# Patient Record
Sex: Female | Born: 1950 | Race: Black or African American | Hispanic: No | Marital: Single | Smoking: Never smoker
Health system: Southern US, Community
[De-identification: ages and names within clinical notes are randomized; demographics above are authoritative.]

## PROBLEM LIST (undated history)

## (undated) DIAGNOSIS — J45909 Unspecified asthma, uncomplicated: Secondary | ICD-10-CM

## (undated) DIAGNOSIS — K219 Gastro-esophageal reflux disease without esophagitis: Secondary | ICD-10-CM

## (undated) HISTORY — PX: BREAST EXCISIONAL BIOPSY: SUR124

## (undated) HISTORY — PX: APPENDECTOMY: SHX54

---

## 2008-05-18 ENCOUNTER — Ambulatory Visit: Payer: Self-pay | Admitting: Family Medicine

## 2008-05-22 ENCOUNTER — Ambulatory Visit: Payer: Self-pay | Admitting: *Deleted

## 2008-06-01 ENCOUNTER — Ambulatory Visit (HOSPITAL_COMMUNITY): Admission: RE | Admit: 2008-06-01 | Discharge: 2008-06-01 | Payer: Self-pay | Admitting: Family Medicine

## 2008-07-17 ENCOUNTER — Ambulatory Visit: Payer: Self-pay | Admitting: Internal Medicine

## 2008-09-28 ENCOUNTER — Encounter: Payer: Self-pay | Admitting: Family Medicine

## 2008-09-28 ENCOUNTER — Ambulatory Visit: Payer: Self-pay | Admitting: Family Medicine

## 2008-09-28 LAB — CONVERTED CEMR LAB
Albumin: 4.5 g/dL (ref 3.5–5.2)
Basophils Absolute: 0 10*3/uL (ref 0.0–0.1)
CO2: 18 meq/L — ABNORMAL LOW (ref 19–32)
Cholesterol: 209 mg/dL — ABNORMAL HIGH (ref 0–200)
Eosinophils Absolute: 0 10*3/uL (ref 0.0–0.7)
HCT: 45.5 % (ref 36.0–46.0)
Hemoglobin: 14.5 g/dL (ref 12.0–15.0)
LDL Cholesterol: 107 mg/dL — ABNORMAL HIGH (ref 0–99)
Lymphocytes Relative: 12 % (ref 12–46)
Lymphs Abs: 2.8 10*3/uL (ref 0.7–4.0)
MCHC: 31.9 g/dL (ref 30.0–36.0)
MCV: 87.8 fL (ref 78.0–100.0)
Monocytes Absolute: 1.6 10*3/uL — ABNORMAL HIGH (ref 0.1–1.0)
Potassium: 3.3 meq/L — ABNORMAL LOW (ref 3.5–5.3)
RBC: 5.18 M/uL — ABNORMAL HIGH (ref 3.87–5.11)
RDW: 15.1 % (ref 11.5–15.5)
Total CHOL/HDL Ratio: 2.5

## 2008-09-29 ENCOUNTER — Encounter: Payer: Self-pay | Admitting: Family Medicine

## 2008-10-02 ENCOUNTER — Ambulatory Visit: Payer: Self-pay | Admitting: Internal Medicine

## 2008-10-08 ENCOUNTER — Ambulatory Visit: Payer: Self-pay | Admitting: Internal Medicine

## 2008-10-08 ENCOUNTER — Encounter: Payer: Self-pay | Admitting: Family Medicine

## 2008-10-08 LAB — CONVERTED CEMR LAB
BUN: 24 mg/dL — ABNORMAL HIGH (ref 6–23)
Basophils Relative: 0 % (ref 0–1)
Chloride: 109 meq/L (ref 96–112)
Eosinophils Absolute: 0.2 10*3/uL (ref 0.0–0.7)
HCT: 42.4 % (ref 36.0–46.0)
Hemoglobin: 13.4 g/dL (ref 12.0–15.0)
Lymphocytes Relative: 28 % (ref 12–46)
MCV: 86.9 fL (ref 78.0–100.0)
Neutro Abs: 8.3 10*3/uL — ABNORMAL HIGH (ref 1.7–7.7)
Platelets: 486 10*3/uL — ABNORMAL HIGH (ref 150–400)
Sodium: 143 meq/L (ref 135–145)

## 2008-11-27 ENCOUNTER — Ambulatory Visit: Payer: Self-pay | Admitting: Family Medicine

## 2008-12-05 ENCOUNTER — Ambulatory Visit (HOSPITAL_COMMUNITY): Admission: RE | Admit: 2008-12-05 | Discharge: 2008-12-05 | Payer: Self-pay | Admitting: Family Medicine

## 2009-01-18 ENCOUNTER — Ambulatory Visit: Payer: Self-pay | Admitting: Family Medicine

## 2009-04-19 ENCOUNTER — Ambulatory Visit: Payer: Self-pay | Admitting: Family Medicine

## 2009-04-19 LAB — CONVERTED CEMR LAB
CO2: 21 meq/L (ref 19–32)
Chloride: 111 meq/L (ref 96–112)
Vit D, 25-Hydroxy: 23 ng/mL — ABNORMAL LOW (ref 30–89)

## 2009-08-27 ENCOUNTER — Ambulatory Visit: Payer: Self-pay | Admitting: Family Medicine

## 2009-08-28 ENCOUNTER — Encounter (INDEPENDENT_AMBULATORY_CARE_PROVIDER_SITE_OTHER): Payer: Self-pay | Admitting: Internal Medicine

## 2009-09-04 ENCOUNTER — Ambulatory Visit (HOSPITAL_COMMUNITY): Admission: RE | Admit: 2009-09-04 | Discharge: 2009-09-04 | Payer: Self-pay | Admitting: Family Medicine

## 2009-11-28 ENCOUNTER — Ambulatory Visit: Payer: Self-pay | Admitting: Internal Medicine

## 2009-12-12 ENCOUNTER — Ambulatory Visit (HOSPITAL_COMMUNITY): Admission: RE | Admit: 2009-12-12 | Discharge: 2009-12-12 | Payer: Self-pay | Admitting: Family Medicine

## 2009-12-23 ENCOUNTER — Ambulatory Visit: Payer: Self-pay | Admitting: Internal Medicine

## 2012-11-21 ENCOUNTER — Emergency Department (HOSPITAL_COMMUNITY): Payer: Worker's Compensation

## 2012-11-21 ENCOUNTER — Emergency Department (HOSPITAL_COMMUNITY)
Admission: EM | Admit: 2012-11-21 | Discharge: 2012-11-21 | Disposition: A | Discharge status: Home / Self Care | Payer: Worker's Compensation | Attending: Emergency Medicine | Admitting: Emergency Medicine

## 2012-11-21 ENCOUNTER — Encounter (HOSPITAL_COMMUNITY): Payer: Self-pay | Admitting: *Deleted

## 2012-11-21 DIAGNOSIS — Y9289 Other specified places as the place of occurrence of the external cause: Secondary | ICD-10-CM | POA: Insufficient documentation

## 2012-11-21 DIAGNOSIS — W268XXA Contact with other sharp object(s), not elsewhere classified, initial encounter: Secondary | ICD-10-CM | POA: Insufficient documentation

## 2012-11-21 DIAGNOSIS — W1809XA Striking against other object with subsequent fall, initial encounter: Secondary | ICD-10-CM | POA: Insufficient documentation

## 2012-11-21 DIAGNOSIS — H538 Other visual disturbances: Secondary | ICD-10-CM | POA: Insufficient documentation

## 2012-11-21 DIAGNOSIS — J45909 Unspecified asthma, uncomplicated: Secondary | ICD-10-CM | POA: Insufficient documentation

## 2012-11-21 DIAGNOSIS — Y990 Civilian activity done for income or pay: Secondary | ICD-10-CM | POA: Insufficient documentation

## 2012-11-21 DIAGNOSIS — IMO0002 Reserved for concepts with insufficient information to code with codable children: Secondary | ICD-10-CM

## 2012-11-21 DIAGNOSIS — S59909A Unspecified injury of unspecified elbow, initial encounter: Secondary | ICD-10-CM | POA: Insufficient documentation

## 2012-11-21 DIAGNOSIS — Z8719 Personal history of other diseases of the digestive system: Secondary | ICD-10-CM | POA: Insufficient documentation

## 2012-11-21 DIAGNOSIS — W010XXA Fall on same level from slipping, tripping and stumbling without subsequent striking against object, initial encounter: Secondary | ICD-10-CM | POA: Insufficient documentation

## 2012-11-21 DIAGNOSIS — S6990XA Unspecified injury of unspecified wrist, hand and finger(s), initial encounter: Secondary | ICD-10-CM | POA: Insufficient documentation

## 2012-11-21 DIAGNOSIS — Z23 Encounter for immunization: Secondary | ICD-10-CM | POA: Insufficient documentation

## 2012-11-21 DIAGNOSIS — W19XXXA Unspecified fall, initial encounter: Secondary | ICD-10-CM

## 2012-11-21 DIAGNOSIS — S0180XA Unspecified open wound of other part of head, initial encounter: Secondary | ICD-10-CM | POA: Insufficient documentation

## 2012-11-21 DIAGNOSIS — Y939 Activity, unspecified: Secondary | ICD-10-CM | POA: Insufficient documentation

## 2012-11-21 DIAGNOSIS — Z789 Other specified health status: Secondary | ICD-10-CM | POA: Insufficient documentation

## 2012-11-21 HISTORY — DX: Unspecified asthma, uncomplicated: J45.909

## 2012-11-21 HISTORY — DX: Gastro-esophageal reflux disease without esophagitis: K21.9

## 2012-11-21 MED ORDER — TETANUS-DIPHTH-ACELL PERTUSSIS 5-2.5-18.5 LF-MCG/0.5 IM SUSP
0.5000 mL | Freq: Once | INTRAMUSCULAR | Status: AC
  Administered 2012-11-21: 0.5 mL via INTRAMUSCULAR
  Filled 2012-11-21: qty 0.5

## 2012-11-21 MED ORDER — TETRACAINE HCL 0.5 % OP SOLN
1.0000 [drp] | Freq: Once | OPHTHALMIC | Status: AC
  Administered 2012-11-21: 1 [drp] via OPHTHALMIC
  Filled 2012-11-21: qty 2

## 2012-11-21 MED ORDER — HYDROCODONE-ACETAMINOPHEN 5-325 MG PO TABS
1.0000 | ORAL_TABLET | Freq: Four times a day (QID) | ORAL | Status: AC | PRN
Start: 2012-11-21 — End: ?

## 2012-11-21 MED ORDER — HYDROCODONE-ACETAMINOPHEN 5-325 MG PO TABS
2.0000 | ORAL_TABLET | Freq: Once | ORAL | Status: AC
  Administered 2012-11-21: 1 via ORAL
  Filled 2012-11-21: qty 1

## 2012-11-21 NOTE — ED Provider Notes (Signed)
Medical screening examination/treatment/procedure(s) were performed by non-physician practitioner and as supervising physician I was immediately available for consultation/collaboration.   Anaiza Behrens M Gurinder Toral, MD 11/21/12 1637 

## 2012-11-21 NOTE — ED Provider Notes (Signed)
History     CSN: [id number]  Arrival date & time 11/21/12  0900   First MD Initiated Contact with Patient 11/21/12 [phone]      Chief Complaint  Patient presents with  . Fall  . Facial Laceration    (Consider location/radiation/quality/duration/timing/severity/associated sxs/prior treatment) HPI Comments: This is a 62 year old female, no pertinent past medical history, who presents to the emergency department with chief complaint of fall. Patient states that she tripped on a sprinkler, and fell to the ground. She braced herself with her arms, but struck her forehead on the concrete. She denies any loss of consciousness. She was wearing her glasses at the time, which cut her left eyebrow. She also complains of swelling of the left eye and blurred vision. She states that her pain is 8/10. Additionally, she complains of left hand and wrist pain. The pain does not radiate. She has not tried taking anything to alleviate her symptoms. She denies chest pain, shortness of breath, and dizziness.  The history is provided by the patient. No language interpreter was used.    Past Medical History  Diagnosis Date  . Asthma   . GERD (gastroesophageal reflux disease)     Past Surgical History  Procedure Laterality Date  . Appendectomy      No family history on file.  History  Substance Use Topics  . Smoking status: Never Smoker   . Smokeless tobacco: Not on file  . Alcohol Use: No    OB History   Grav Para Term Preterm Abortions TAB SAB Ect Mult Living                  Review of Systems  All other systems reviewed and are negative.    Allergies  Review of patient's allergies indicates no known allergies.  Home Medications  No current outpatient prescriptions on file.  BP 160/88  Pulse 103  Temp(Src) 98 F (36.7 C) (Oral)  Resp 18  SpO2 98%  Physical Exam  Nursing note and vitals reviewed. Constitutional: She is oriented to person, place, and time. She appears  well-developed and well-nourished.  HENT:  Head: Normocephalic and atraumatic.  A 3 cm laceration of left eyebrow, moderate swelling around the left eye, no obvious bony deformity or step-offs, tenderness to palpation around the orbit    Eyes: Conjunctivae and EOM are normal. Pupils are equal, round, and reactive to light.  Fluorescein reveals no corneal abrasion.  Tonopen reading attempted, but did not calibrate.  When the reading was attempted the eye was WNL, and was NOT hard  Visual Acuity R 20/40 L 20/200, however patient wears glasses, and did not during exam, significant swelling of the eyelid and surrounding tissue  Conjunctiva is red and inflamed, but no evidence of foreign bodies  EOM intact and not painful  Neck: Normal range of motion. Neck supple.  Cardiovascular: Normal rate and regular rhythm.  Exam reveals no gallop and no friction rub.   No murmur heard. Pulmonary/Chest: Effort normal and breath sounds normal. No respiratory distress. She has no wheezes. She has no rales. She exhibits no tenderness.  Abdominal: Soft. Bowel sounds are normal. She exhibits no distension and no mass. There is no tenderness. There is no rebound and no guarding.  Musculoskeletal: Normal range of motion. She exhibits no edema and no tenderness.  Neurological: She is alert and oriented to person, place, and time.  Skin: Skin is warm and dry.  3 cm laceration to left eyebrow, no foreign bodies  Psychiatric: She has a normal mood and affect. Her behavior is normal. Judgment and thought content normal.    ED Course  Procedures (including critical care time)  Results for orders placed in visit on 08/27/09  CONVERTED [facility] LAB      Result Value Range   Helicobacter Pylori Antibody-IgG 0.8 ISR     Dg Facial Bones Complete  11/21/2012  **ADDENDUM** CREATED: 11/21/2012 11:10:31  The expanded sella turcica is probably related to an empty sella based on the previous head CT exam from 12/05/2008.   **END ADDENDUM** SIGNED BY: Richarda Overlie, M.D.   11/21/2012  *RADIOLOGY REPORT*  Clinical Data: Fall with left arm laceration and bruising over the left orbit.  FACIAL BONES COMPLETE 3+V  Comparison: Head CT 12/05/2008  Findings: Four views of the facial bones were obtained.  There is symmetric aeration of the paranasal sinuses.  There are bilateral dental fillings.  The orbital bones appear to be intact.  Nasal bones are intact.  No gross abnormality to the mandible.  Zygomatic arches are intact. The patient may have an enlarged sella turcica but limited evaluation on this examination.  IMPRESSION: No evidence for a displaced facial bone fracture.  If there is high clinical concern for an occult injury, recommend further evaluation with CT.  Question an enlarged sella turcica.  Original Report Authenticated By: Richarda Overlie, M.D.    Dg Wrist Complete Left  11/21/2012  *RADIOLOGY REPORT*  Clinical Data: Fall, pain.  LEFT WRIST - COMPLETE 3+ VIEW  Comparison: None.  Findings: No acute bony or joint abnormality is identified. Degenerative change is seen about the first [facility] joint.  Soft tissue structures are unremarkable.  IMPRESSION:  1.  No acute finding. 2.  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   Original Report Authenticated By: Holley Dexter, M.D.    Dg Hand Complete Left  11/21/2012  *RADIOLOGY REPORT*  Clinical Data: Fall, pain.  LEFT HAND - COMPLETE 3+ VIEW  Comparison: None.  Findings: No acute bony or joint abnormality is identified.  The patient has some degenerative disease of the first [facility] joint and about the DIP joints.  Soft tissue structures are unremarkable.  IMPRESSION: No acute finding.   Original Report Authenticated By: Holley Dexter, M.D.    Ct Maxillofacial Wo Cm  11/21/2012  *RADIOLOGY REPORT*  Clinical Data: Fall.  Laceration to left supra orbital region.  CT MAXILLOFACIAL WITHOUT CONTRAST  Technique:  Multidetector CT imaging of the maxillofacial structures was performed. Multiplanar CT image  reconstructions were also generated.  Comparison: None.  Findings: The mandible is intact.  Temporomandibular joints are located.  No orbital rim or orbital wall fracture on either side. The zygomatic arches are intact.  Nasal bones are intact.  No evidence for fluid in the paranasal sinuses to suggest hemorrhage. The  IMPRESSION: No acute fracture in the bones of the face.   Original Report Authenticated By: Kennith Center, M.D.     LACERATION REPAIR Performed by: Roxy Horseman Authorized by: Roxy Horseman Consent: Verbal consent obtained. Risks and benefits: risks, benefits and alternatives were discussed Consent given by: patient Patient identity confirmed: provided demographic data Prepped and Draped in normal sterile fashion Wound explored  Laceration Location: Left eyebrow  Laceration Length: 3 cm  No Foreign Bodies seen or palpated  Anesthesia: local infiltration  Local anesthetic: lidocaine 2% without epinephrine  Anesthetic total: 4 ml  Irrigation method: syringe Amount of cleaning: standard  Skin closure: 5-0 prolene  Number of sutures: 4  Technique: simple  Patient tolerance: Patient tolerated the procedure well with no immediate complications.   1. Fall, initial encounter   2. Laceration       MDM  62 year old female with mechanical fall. No loss of consciousness. Laceration left eyebrow. Order facial bones plain film for further evaluation, complete eye exam, and plain films of left wrist.  Pain control with Norco. Patient is ambulatory, and denies chest pain, SOB, and dizziness.  Tetanus updated, laceration repaired, no further visual complaints, patient feels well. She is stable and ready for discharge.        Roxy Horseman, PA-C 11/21/12 1332

## 2012-11-21 NOTE — ED Notes (Signed)
Patient transported to X-ray 

## 2012-11-21 NOTE — ED Notes (Signed)
Pt reports tripping on a sprinkler at work-landed on a concrete floor.  Pt reports wearing her glasses when she fell.  Lac noted on L eye lid, mild bruising noted around her L eye as well.  Pt reports blurred vision when she is able to open her L eye.  Pt also reports "i felt like i wanted to pass out but i made myself not to."  Pt also reports soreness to L knee and L wrist.  No obvious deformity noted at this time.

## 2016-01-10 ENCOUNTER — Encounter: Payer: Self-pay | Admitting: Gastroenterology

## 2016-09-02 DIAGNOSIS — R498 Other voice and resonance disorders: Secondary | ICD-10-CM | POA: Diagnosis not present

## 2016-09-02 DIAGNOSIS — J069 Acute upper respiratory infection, unspecified: Secondary | ICD-10-CM | POA: Diagnosis not present

## 2016-09-24 DIAGNOSIS — E669 Obesity, unspecified: Secondary | ICD-10-CM | POA: Diagnosis not present

## 2016-09-24 DIAGNOSIS — Z6833 Body mass index (BMI) 33.0-33.9, adult: Secondary | ICD-10-CM | POA: Diagnosis not present

## 2017-01-01 DIAGNOSIS — H5213 Myopia, bilateral: Secondary | ICD-10-CM | POA: Diagnosis not present

## 2017-01-15 DIAGNOSIS — J069 Acute upper respiratory infection, unspecified: Secondary | ICD-10-CM | POA: Diagnosis not present

## 2017-01-15 DIAGNOSIS — K219 Gastro-esophageal reflux disease without esophagitis: Secondary | ICD-10-CM | POA: Diagnosis not present

## 2017-01-21 DIAGNOSIS — Z6833 Body mass index (BMI) 33.0-33.9, adult: Secondary | ICD-10-CM | POA: Diagnosis not present

## 2017-01-21 DIAGNOSIS — I10 Essential (primary) hypertension: Secondary | ICD-10-CM | POA: Diagnosis not present

## 2017-01-21 DIAGNOSIS — K219 Gastro-esophageal reflux disease without esophagitis: Secondary | ICD-10-CM | POA: Diagnosis not present

## 2017-01-21 DIAGNOSIS — E669 Obesity, unspecified: Secondary | ICD-10-CM | POA: Diagnosis not present

## 2017-01-21 DIAGNOSIS — Z79899 Other long term (current) drug therapy: Secondary | ICD-10-CM | POA: Diagnosis not present

## 2017-01-21 DIAGNOSIS — Z0000 Encounter for general adult medical examination without abnormal findings: Secondary | ICD-10-CM | POA: Diagnosis not present

## 2017-01-21 DIAGNOSIS — Z23 Encounter for immunization: Secondary | ICD-10-CM | POA: Diagnosis not present

## 2017-01-21 DIAGNOSIS — Z13820 Encounter for screening for osteoporosis: Secondary | ICD-10-CM | POA: Diagnosis not present

## 2017-02-01 DIAGNOSIS — Z0110 Encounter for examination of ears and hearing without abnormal findings: Secondary | ICD-10-CM | POA: Diagnosis not present

## 2017-02-01 DIAGNOSIS — Z0289 Encounter for other administrative examinations: Secondary | ICD-10-CM | POA: Diagnosis not present

## 2017-02-02 DIAGNOSIS — Z111 Encounter for screening for respiratory tuberculosis: Secondary | ICD-10-CM | POA: Diagnosis not present

## 2017-02-02 DIAGNOSIS — Z23 Encounter for immunization: Secondary | ICD-10-CM | POA: Diagnosis not present

## 2017-02-04 DIAGNOSIS — D473 Essential (hemorrhagic) thrombocythemia: Secondary | ICD-10-CM | POA: Insufficient documentation

## 2017-02-11 DIAGNOSIS — D473 Essential (hemorrhagic) thrombocythemia: Secondary | ICD-10-CM | POA: Diagnosis not present

## 2017-04-05 DIAGNOSIS — Z23 Encounter for immunization: Secondary | ICD-10-CM | POA: Diagnosis not present

## 2017-07-15 DIAGNOSIS — R51 Headache: Secondary | ICD-10-CM | POA: Diagnosis not present

## 2017-07-30 DIAGNOSIS — D473 Essential (hemorrhagic) thrombocythemia: Secondary | ICD-10-CM | POA: Diagnosis not present

## 2017-07-30 DIAGNOSIS — R51 Headache: Secondary | ICD-10-CM | POA: Diagnosis not present

## 2017-07-30 DIAGNOSIS — Z79899 Other long term (current) drug therapy: Secondary | ICD-10-CM | POA: Diagnosis not present

## 2017-07-30 DIAGNOSIS — Z23 Encounter for immunization: Secondary | ICD-10-CM | POA: Diagnosis not present

## 2017-07-30 DIAGNOSIS — I10 Essential (primary) hypertension: Secondary | ICD-10-CM | POA: Diagnosis not present

## 2017-08-05 DIAGNOSIS — Z23 Encounter for immunization: Secondary | ICD-10-CM | POA: Diagnosis not present

## 2017-08-16 DIAGNOSIS — Z7982 Long term (current) use of aspirin: Secondary | ICD-10-CM

## 2017-08-16 DIAGNOSIS — D473 Essential (hemorrhagic) thrombocythemia: Secondary | ICD-10-CM

## 2017-11-30 DIAGNOSIS — R300 Dysuria: Secondary | ICD-10-CM | POA: Diagnosis not present

## 2018-02-14 DIAGNOSIS — D473 Essential (hemorrhagic) thrombocythemia: Secondary | ICD-10-CM

## 2018-02-14 DIAGNOSIS — Z7982 Long term (current) use of aspirin: Secondary | ICD-10-CM

## 2018-02-18 DIAGNOSIS — I10 Essential (primary) hypertension: Secondary | ICD-10-CM | POA: Diagnosis not present

## 2018-03-02 DIAGNOSIS — E669 Obesity, unspecified: Secondary | ICD-10-CM | POA: Diagnosis not present

## 2018-03-02 DIAGNOSIS — K219 Gastro-esophageal reflux disease without esophagitis: Secondary | ICD-10-CM | POA: Diagnosis not present

## 2018-03-02 DIAGNOSIS — D473 Essential (hemorrhagic) thrombocythemia: Secondary | ICD-10-CM | POA: Diagnosis not present

## 2018-03-02 DIAGNOSIS — Z23 Encounter for immunization: Secondary | ICD-10-CM | POA: Diagnosis not present

## 2018-03-02 DIAGNOSIS — I10 Essential (primary) hypertension: Secondary | ICD-10-CM | POA: Diagnosis not present

## 2018-03-02 DIAGNOSIS — Z0000 Encounter for general adult medical examination without abnormal findings: Secondary | ICD-10-CM | POA: Diagnosis not present

## 2018-03-02 DIAGNOSIS — E7800 Pure hypercholesterolemia, unspecified: Secondary | ICD-10-CM | POA: Diagnosis not present

## 2018-03-02 DIAGNOSIS — Z6831 Body mass index (BMI) 31.0-31.9, adult: Secondary | ICD-10-CM | POA: Diagnosis not present

## 2018-03-02 DIAGNOSIS — Z13820 Encounter for screening for osteoporosis: Secondary | ICD-10-CM | POA: Diagnosis not present

## 2018-04-01 DIAGNOSIS — M8589 Other specified disorders of bone density and structure, multiple sites: Secondary | ICD-10-CM | POA: Diagnosis not present

## 2018-04-01 DIAGNOSIS — Z13820 Encounter for screening for osteoporosis: Secondary | ICD-10-CM | POA: Diagnosis not present

## 2018-04-01 DIAGNOSIS — Z1231 Encounter for screening mammogram for malignant neoplasm of breast: Secondary | ICD-10-CM | POA: Diagnosis not present

## 2018-04-07 DIAGNOSIS — H524 Presbyopia: Secondary | ICD-10-CM | POA: Diagnosis not present

## 2018-04-07 DIAGNOSIS — Z0100 Encounter for examination of eyes and vision without abnormal findings: Secondary | ICD-10-CM | POA: Diagnosis not present

## 2018-06-27 DIAGNOSIS — Z23 Encounter for immunization: Secondary | ICD-10-CM | POA: Diagnosis not present

## 2018-08-16 DIAGNOSIS — Z7982 Long term (current) use of aspirin: Secondary | ICD-10-CM | POA: Diagnosis not present

## 2018-08-16 DIAGNOSIS — D473 Essential (hemorrhagic) thrombocythemia: Secondary | ICD-10-CM | POA: Diagnosis not present

## 2018-08-16 DIAGNOSIS — Z79899 Other long term (current) drug therapy: Secondary | ICD-10-CM | POA: Diagnosis not present

## 2018-09-02 ENCOUNTER — Other Ambulatory Visit: Payer: Self-pay | Admitting: Physician Assistant

## 2018-09-02 ENCOUNTER — Ambulatory Visit
Admission: RE | Admit: 2018-09-02 | Discharge: 2018-09-02 | Disposition: A | Discharge status: Home / Self Care | Payer: Medicare HMO | Source: Ambulatory Visit | Attending: Physician Assistant | Admitting: Physician Assistant

## 2018-09-02 DIAGNOSIS — M1711 Unilateral primary osteoarthritis, right knee: Secondary | ICD-10-CM | POA: Diagnosis not present

## 2018-09-02 DIAGNOSIS — M25562 Pain in left knee: Principal | ICD-10-CM

## 2018-09-02 DIAGNOSIS — M25561 Pain in right knee: Secondary | ICD-10-CM

## 2018-09-02 DIAGNOSIS — M1712 Unilateral primary osteoarthritis, left knee: Secondary | ICD-10-CM | POA: Diagnosis not present

## 2018-09-05 DIAGNOSIS — J069 Acute upper respiratory infection, unspecified: Secondary | ICD-10-CM | POA: Diagnosis not present

## 2018-09-05 DIAGNOSIS — I10 Essential (primary) hypertension: Secondary | ICD-10-CM | POA: Diagnosis not present

## 2018-09-05 DIAGNOSIS — K219 Gastro-esophageal reflux disease without esophagitis: Secondary | ICD-10-CM | POA: Diagnosis not present

## 2018-10-26 DIAGNOSIS — D473 Essential (hemorrhagic) thrombocythemia: Secondary | ICD-10-CM | POA: Diagnosis not present

## 2019-03-06 DIAGNOSIS — Z0000 Encounter for general adult medical examination without abnormal findings: Secondary | ICD-10-CM | POA: Diagnosis not present

## 2019-03-06 DIAGNOSIS — R070 Pain in throat: Secondary | ICD-10-CM | POA: Diagnosis not present

## 2019-03-06 DIAGNOSIS — D473 Essential (hemorrhagic) thrombocythemia: Secondary | ICD-10-CM | POA: Diagnosis not present

## 2019-03-06 DIAGNOSIS — I10 Essential (primary) hypertension: Secondary | ICD-10-CM | POA: Diagnosis not present

## 2019-03-06 DIAGNOSIS — Z6831 Body mass index (BMI) 31.0-31.9, adult: Secondary | ICD-10-CM | POA: Diagnosis not present

## 2019-03-06 DIAGNOSIS — K219 Gastro-esophageal reflux disease without esophagitis: Secondary | ICD-10-CM | POA: Diagnosis not present

## 2019-03-06 DIAGNOSIS — E669 Obesity, unspecified: Secondary | ICD-10-CM | POA: Diagnosis not present

## 2019-03-16 ENCOUNTER — Encounter: Payer: Self-pay | Admitting: Gastroenterology

## 2019-03-20 DIAGNOSIS — D473 Essential (hemorrhagic) thrombocythemia: Secondary | ICD-10-CM | POA: Diagnosis not present

## 2019-04-05 DIAGNOSIS — Z1231 Encounter for screening mammogram for malignant neoplasm of breast: Secondary | ICD-10-CM | POA: Diagnosis not present

## 2019-05-18 DIAGNOSIS — R69 Illness, unspecified: Secondary | ICD-10-CM | POA: Diagnosis not present

## 2019-07-21 DIAGNOSIS — D473 Essential (hemorrhagic) thrombocythemia: Secondary | ICD-10-CM | POA: Diagnosis not present

## 2019-08-22 DIAGNOSIS — J189 Pneumonia, unspecified organism: Secondary | ICD-10-CM | POA: Diagnosis not present

## 2019-08-22 DIAGNOSIS — U071 COVID-19: Secondary | ICD-10-CM | POA: Diagnosis not present

## 2019-08-22 DIAGNOSIS — J1289 Other viral pneumonia: Secondary | ICD-10-CM | POA: Diagnosis not present

## 2019-08-22 DIAGNOSIS — R109 Unspecified abdominal pain: Secondary | ICD-10-CM | POA: Diagnosis not present

## 2019-08-30 ENCOUNTER — Emergency Department (HOSPITAL_COMMUNITY): Payer: Medicare HMO

## 2019-08-30 ENCOUNTER — Other Ambulatory Visit: Payer: Self-pay

## 2019-08-30 ENCOUNTER — Encounter (HOSPITAL_COMMUNITY): Payer: Self-pay

## 2019-08-30 ENCOUNTER — Inpatient Hospital Stay (HOSPITAL_COMMUNITY)
Admission: EM | Admit: 2019-08-30 | Discharge: 2019-09-05 | DRG: 177 | Disposition: A | Discharge status: Home / Self Care | Payer: Medicare HMO | Attending: Internal Medicine | Admitting: Internal Medicine

## 2019-08-30 DIAGNOSIS — R079 Chest pain, unspecified: Secondary | ICD-10-CM | POA: Diagnosis not present

## 2019-08-30 DIAGNOSIS — M810 Age-related osteoporosis without current pathological fracture: Secondary | ICD-10-CM | POA: Diagnosis present

## 2019-08-30 DIAGNOSIS — R000 Tachycardia, unspecified: Secondary | ICD-10-CM | POA: Diagnosis not present

## 2019-08-30 DIAGNOSIS — J1289 Other viral pneumonia: Secondary | ICD-10-CM | POA: Diagnosis not present

## 2019-08-30 DIAGNOSIS — E876 Hypokalemia: Secondary | ICD-10-CM | POA: Diagnosis present

## 2019-08-30 DIAGNOSIS — J45909 Unspecified asthma, uncomplicated: Secondary | ICD-10-CM | POA: Diagnosis present

## 2019-08-30 DIAGNOSIS — R0902 Hypoxemia: Secondary | ICD-10-CM | POA: Diagnosis not present

## 2019-08-30 DIAGNOSIS — U071 COVID-19: Secondary | ICD-10-CM | POA: Diagnosis not present

## 2019-08-30 DIAGNOSIS — I10 Essential (primary) hypertension: Secondary | ICD-10-CM | POA: Diagnosis present

## 2019-08-30 DIAGNOSIS — Z79899 Other long term (current) drug therapy: Secondary | ICD-10-CM

## 2019-08-30 DIAGNOSIS — T502X5A Adverse effect of carbonic-anhydrase inhibitors, benzothiadiazides and other diuretics, initial encounter: Secondary | ICD-10-CM | POA: Diagnosis present

## 2019-08-30 DIAGNOSIS — R072 Precordial pain: Secondary | ICD-10-CM | POA: Diagnosis not present

## 2019-08-30 DIAGNOSIS — J90 Pleural effusion, not elsewhere classified: Secondary | ICD-10-CM | POA: Diagnosis present

## 2019-08-30 DIAGNOSIS — J1282 Pneumonia due to coronavirus disease 2019: Secondary | ICD-10-CM | POA: Diagnosis present

## 2019-08-30 DIAGNOSIS — J9600 Acute respiratory failure, unspecified whether with hypoxia or hypercapnia: Secondary | ICD-10-CM | POA: Diagnosis present

## 2019-08-30 DIAGNOSIS — R042 Hemoptysis: Secondary | ICD-10-CM | POA: Diagnosis present

## 2019-08-30 DIAGNOSIS — K219 Gastro-esophageal reflux disease without esophagitis: Secondary | ICD-10-CM | POA: Diagnosis present

## 2019-08-30 DIAGNOSIS — J189 Pneumonia, unspecified organism: Secondary | ICD-10-CM | POA: Diagnosis not present

## 2019-08-30 DIAGNOSIS — J9601 Acute respiratory failure with hypoxia: Secondary | ICD-10-CM | POA: Diagnosis present

## 2019-08-30 DIAGNOSIS — R1084 Generalized abdominal pain: Secondary | ICD-10-CM | POA: Diagnosis not present

## 2019-08-30 DIAGNOSIS — I2699 Other pulmonary embolism without acute cor pulmonale: Secondary | ICD-10-CM | POA: Diagnosis present

## 2019-08-30 LAB — D-DIMER, QUANTITATIVE: D-Dimer, Quant: 10.3 ug/mL-FEU — ABNORMAL HIGH (ref 0.00–0.50)

## 2019-08-30 LAB — COMPREHENSIVE METABOLIC PANEL
ALT: 20 U/L (ref 0–44)
AST: 28 U/L (ref 15–41)
Albumin: 2.2 g/dL — ABNORMAL LOW (ref 3.5–5.0)
Alkaline Phosphatase: 96 U/L (ref 38–126)
Anion gap: 20 — ABNORMAL HIGH (ref 5–15)
BUN: 11 mg/dL (ref 8–23)
CO2: 22 mmol/L (ref 22–32)
Calcium: 8.8 mg/dL — ABNORMAL LOW (ref 8.9–10.3)
Chloride: 98 mmol/L (ref 98–111)
Creatinine, Ser: 0.98 mg/dL (ref 0.44–1.00)
GFR calc Af Amer: >60 mL/min (ref >60)
GFR calc non Af Amer: 59 mL/min — ABNORMAL LOW (ref >60)
Glucose, Bld: 101 mg/dL — ABNORMAL HIGH (ref 70–99)
Potassium: 3.4 mmol/L — ABNORMAL LOW (ref 3.5–5.1)
Sodium: 140 mmol/L (ref 135–145)
Total Bilirubin: 1 mg/dL (ref 0.3–1.2)
Total Protein: 11.5 g/dL — ABNORMAL HIGH (ref 6.5–8.1)

## 2019-08-30 LAB — CBC WITH DIFFERENTIAL/PLATELET
Abs Immature Granulocytes: 0.69 10*3/uL — ABNORMAL HIGH (ref 0.00–0.07)
Basophils Absolute: 0.1 10*3/uL (ref 0.0–0.1)
Basophils Relative: 0 %
Eosinophils Absolute: 0.1 10*3/uL (ref 0.0–0.5)
Eosinophils Relative: 1 %
HCT: 39.5 % (ref 36.0–46.0)
Hemoglobin: 12.6 g/dL (ref 12.0–15.0)
Immature Granulocytes: 4 %
Lymphocytes Relative: 10 %
Lymphs Abs: 1.9 10*3/uL (ref 0.7–4.0)
MCH: 30.3 pg (ref 26.0–34.0)
MCHC: 31.9 g/dL (ref 30.0–36.0)
MCV: 95 fL (ref 80.0–100.0)
Monocytes Absolute: 1.2 10*3/uL — ABNORMAL HIGH (ref 0.1–1.0)
Monocytes Relative: 7 %
Neutro Abs: 14.2 10*3/uL — ABNORMAL HIGH (ref 1.7–7.7)
Neutrophils Relative %: 78 %
Platelets: 611 10*3/uL — ABNORMAL HIGH (ref 150–400)
RBC: 4.16 MIL/uL (ref 3.87–5.11)
RDW: 13.8 % (ref 11.5–15.5)
WBC: 18.1 10*3/uL — ABNORMAL HIGH (ref 4.0–10.5)
nRBC: 0 % (ref 0.0–0.2)

## 2019-08-30 LAB — TROPONIN I (HIGH SENSITIVITY)
Troponin I (High Sensitivity): 29 ng/L — ABNORMAL HIGH (ref <18)
Troponin I (High Sensitivity): 23 ng/L — ABNORMAL HIGH (ref <18)

## 2019-08-30 LAB — LACTATE DEHYDROGENASE: LDH: 328 U/L — ABNORMAL HIGH (ref 98–192)

## 2019-08-30 LAB — BRAIN NATRIURETIC PEPTIDE: B Natriuretic Peptide: 72.6 pg/mL (ref 0.0–100.0)

## 2019-08-30 LAB — C-REACTIVE PROTEIN: CRP: 27.9 mg/dL — ABNORMAL HIGH (ref <1.0)

## 2019-08-30 LAB — FIBRINOGEN: Fibrinogen: >800 mg/dL — ABNORMAL HIGH (ref 210–475)

## 2019-08-30 LAB — FERRITIN: Ferritin: 634 ng/mL — ABNORMAL HIGH (ref 11–307)

## 2019-08-30 LAB — LACTIC ACID, PLASMA: Lactic Acid, Venous: 1.6 mmol/L (ref 0.5–1.9)

## 2019-08-30 LAB — TRIGLYCERIDES: Triglycerides: 143 mg/dL (ref <150)

## 2019-08-30 MED ORDER — LACTATED RINGERS IV BOLUS
1000.0000 mL | Freq: Once | INTRAVENOUS | Status: AC
  Administered 2019-08-30: 1000 mL via INTRAVENOUS

## 2019-08-30 NOTE — ED Provider Notes (Signed)
[facility] EMERGENCY DEPARTMENT Provider Note   CSN: [id number] Arrival date & time: 08/30/19  1720     History Chief Complaint  Patient presents with  . Hemoptysis  . Shortness of Breath  . COVID    Anne [name] is a 68 y.o. female.  HPI Anne [name] is a 68 y.o. female with a medical history of asthma, gerd who presents to the ED for right sided chest pain, hemoptysis and shortness of breath. She reports recently was at [facility] and was diagnosed with pneumonia but "I was not treated with antibiotics". She states although did have a covid test upon discharge and was notified that it was positive. She reports since this time has continued to have shortness of breath that worsened today and also started having right sided chest pain and hemoptysis today. She does not take blood thinners. Denies h/o blood clot. She denies cad, states her pain is pleuritic, non radiating and sharp.      Past Medical History:  Diagnosis Date  . Asthma   . GERD (gastroesophageal reflux disease)     Patient Active Problem List   Diagnosis Date Noted  . Acute respiratory failure due to COVID-19 [facility]) 08/31/2019    Past Surgical History:  Procedure Laterality Date  . APPENDECTOMY       OB History   No obstetric history on file.     Family History  Problem Relation Age of Onset  . Diabetes Mellitus II Other     Social History   Tobacco Use  . Smoking status: Never Smoker  . Smokeless tobacco: Never Used  Substance Use Topics  . Alcohol use: No  . Drug use: No    Home Medications Prior to Admission medications   Medication Sig Start Date End Date Taking? Authorizing Provider  acetaminophen (TYLENOL) 500 MG tablet Take 500 mg by mouth every 6 (six) hours as needed (pain).   Yes [provider]  albuterol (PROVENTIL HFA;VENTOLIN HFA) 108 (90 BASE) MCG/ACT inhaler Inhale 2 puffs into the lungs every 6 (six) hours as needed for  wheezing.   Yes [provider]  amLODipine (NORVASC) 10 MG tablet Take 10 mg by mouth daily. 03/06/19  Yes [provider]  calcium carbonate (TUMS - DOSED IN MG ELEMENTAL CALCIUM) 500 MG chewable tablet Chew 1 tablet by mouth daily.   Yes [provider]  chlorthalidone (HYGROTON) 25 MG tablet Take 25 mg by mouth daily. 08/15/19  Yes [provider]  docusate sodium (COLACE) 100 MG capsule Take 200 mg by mouth at bedtime.   Yes [provider]  famotidine (PEPCID) 20 MG tablet Take 20 mg by mouth daily. 07/04/19  Yes [provider]  HYDROcodone-acetaminophen (NORCO/VICODIN) 5-325 MG per tablet Take 1 tablet by mouth every 6 (six) hours as needed for pain. 11/21/12  Yes Montine Circle, PA-C  hydroxyurea (HYDREA) 500 MG capsule Take 500-1,000 mg by mouth See admin instructions. Pt alternates 50 mg every other Anne [name], with 100 mg 07/21/19  Yes [provider]  ibuprofen (ADVIL) 200 MG tablet Take 400 mg by mouth every 6 (six) hours as needed (pain).   Yes [provider]  lansoprazole (PREVACID) 30 MG capsule Take 30 mg by mouth daily. 06/05/19  Yes [provider]  Multiple Vitamin (MULTIVITAMIN WITH MINERALS) TABS Take 1 tablet by mouth daily.   Yes [provider]    Allergies    Patient has no known allergies.  Review of  Systems   Review of Systems  Constitutional: Positive for fatigue. Negative for chills and fever.  HENT: Negative for ear pain and sore throat.   Eyes: Negative for pain and visual disturbance.  Respiratory: Positive for cough and shortness of breath.   Cardiovascular: Positive for chest pain. Negative for palpitations.  Gastrointestinal: Negative for abdominal pain and vomiting.  Genitourinary: Negative for dysuria and hematuria.  Musculoskeletal: Negative for arthralgias and back pain.  Skin: Negative for color change and rash.  Neurological: Negative for seizures and syncope.  All  other systems reviewed and are negative.   Physical Exam Updated Vital Signs BP 131/89   Pulse 83   Temp 99.5 F (37.5 C) (Oral)   Resp 14   Ht 5\' 6"  (1.676 m)   Wt 82.6 kg   SpO2 97%   BMI 29.38 kg/m   Physical Exam Vitals and nursing note reviewed.  Constitutional:      General: She is in acute distress.     Appearance: Normal appearance. She is well-developed. She is not ill-appearing.     Comments: Female who appears stated age, mild acute distress. Comfortable at rest on room air  HENT:     Head: Normocephalic and atraumatic.     Right Ear: External ear normal.     Left Ear: External ear normal.     Nose: Nose normal. No rhinorrhea.     Mouth/Throat:     Mouth: Mucous membranes are moist.  Eyes:     General:        Right eye: No discharge.        Left eye: No discharge.     Conjunctiva/sclera: Conjunctivae normal.  Cardiovascular:     Rate and Rhythm: Normal rate and regular rhythm.     Pulses: Normal pulses.     Heart sounds: Normal heart sounds. No murmur.  Pulmonary:     Effort: Pulmonary effort is normal. No respiratory distress.     Breath sounds: Normal breath sounds. No wheezing or rales.  Abdominal:     General: Abdomen is flat. There is no distension.     Palpations: Abdomen is soft.     Tenderness: There is no abdominal tenderness.  Musculoskeletal:        General: No deformity or signs of injury. Normal range of motion.     Cervical back: Normal range of motion and neck supple.  Skin:    General: Skin is warm and dry.     Capillary Refill: Capillary refill takes less than 2 seconds.     Coloration: Skin is not jaundiced.  Neurological:     General: No focal deficit present.     Mental Status: She is alert. Mental status is at baseline.  Psychiatric:        Mood and Affect: Mood normal.        Behavior: Behavior normal.     ED Results / Procedures / Treatments   Labs (all labs ordered are listed, but only abnormal results are  displayed) Labs Reviewed  CBC WITH DIFFERENTIAL/PLATELET - Abnormal; Notable for the following components:      Result Value   WBC 18.1 (*)    Platelets 611 (*)    Neutro Abs 14.2 (*)    Monocytes Absolute 1.2 (*)    Abs Immature Granulocytes 0.69 (*)    All other components within normal limits  COMPREHENSIVE METABOLIC PANEL - Abnormal; Notable for the following components:   Potassium 3.4 (*)  Glucose, Bld 101 (*)    Calcium 8.8 (*)    Total Protein 11.5 (*)    Albumin 2.2 (*)    GFR calc non Af Amer 59 (*)    Anion gap 20 (*)    All other components within normal limits  D-DIMER, QUANTITATIVE (NOT AT [facility]) - Abnormal; Notable for the following components:   D-Dimer, Quant 10.30 (*)    All other components within normal limits  LACTATE DEHYDROGENASE - Abnormal; Notable for the following components:   LDH 328 (*)    All other components within normal limits  FIBRINOGEN - Abnormal; Notable for the following components:   Fibrinogen >800 (*)    All other components within normal limits  C-REACTIVE PROTEIN - Abnormal; Notable for the following components:   CRP 27.9 (*)    All other components within normal limits  FERRITIN - Abnormal; Notable for the following components:   Ferritin 634 (*)    All other components within normal limits  HEPARIN LEVEL (UNFRACTIONATED) - Abnormal; Notable for the following components:   Heparin Unfractionated <0.10 (*)    All other components within normal limits  COMPREHENSIVE METABOLIC PANEL - Abnormal; Notable for the following components:   Potassium 3.1 (*)    Glucose, Bld 108 (*)    Calcium 8.1 (*)    Albumin 1.9 (*)    All other components within normal limits  CBC WITH DIFFERENTIAL/PLATELET - Abnormal; Notable for the following components:   WBC 12.8 (*)    RBC 3.59 (*)    Hemoglobin 10.9 (*)    HCT 34.4 (*)    Platelets 564 (*)    Neutro Abs 10.4 (*)    Abs Immature Granulocytes 0.57 (*)    All other components within normal  limits  C-REACTIVE PROTEIN - Abnormal; Notable for the following components:   CRP 23.0 (*)    All other components within normal limits  FERRITIN - Abnormal; Notable for the following components:   Ferritin 572 (*)    All other components within normal limits  TROPONIN I (HIGH SENSITIVITY) - Abnormal; Notable for the following components:   Troponin I (High Sensitivity) 29 (*)    All other components within normal limits  TROPONIN I (HIGH SENSITIVITY) - Abnormal; Notable for the following components:   Troponin I (High Sensitivity) 23 (*)    All other components within normal limits  TROPONIN I (HIGH SENSITIVITY) - Abnormal; Notable for the following components:   Troponin I (High Sensitivity) 22 (*)    All other components within normal limits  TROPONIN I (HIGH SENSITIVITY) - Abnormal; Notable for the following components:   Troponin I (High Sensitivity) 22 (*)    All other components within normal limits  CULTURE, BLOOD (ROUTINE X 2)  CULTURE, BLOOD (ROUTINE X 2)  LACTIC ACID, PLASMA  BRAIN NATRIURETIC PEPTIDE  TRIGLYCERIDES  HIV ANTIBODY (ROUTINE TESTING W REFLEX)  PROCALCITONIN  LACTIC ACID, PLASMA  HEPARIN LEVEL (UNFRACTIONATED)  HEPARIN LEVEL (UNFRACTIONATED)  CBC    EKG None  Radiology CT Angio Chest PE W and/or Wo Contrast  Result Date: 08/31/2019 CLINICAL DATA:  Shortness of breath. Chest pain. Hemoptysis. EXAM: CT ANGIOGRAPHY CHEST WITH CONTRAST TECHNIQUE: Multidetector CT imaging of the chest was performed using the standard protocol during bolus administration of intravenous contrast. Multiplanar CT image reconstructions and MIPs were obtained to evaluate the vascular anatomy. CONTRAST:  58mL OMNIPAQUE IOHEXOL 350 MG/ML SOLN COMPARISON:  Radiograph yesterday. FINDINGS: Cardiovascular: Positive for acute PE with filling  defects in the right lower lobe segmental and subsegmental branches. Additional filling defects within the central right middle lobar branches. No  definite left-sided pulmonary emboli. Mild straightening of the intraventricular septum with RV to LV ratio of 1.16. Mild aortic atherosclerosis without aneurysm. Heart is normal in size. No pericardial effusion. Mediastinum/Nodes: Small mediastinal and hilar nodes, all subcentimeter. No thyroid nodule. Small to moderate hiatal hernia with minimal retained contents in the distal esophagus Lungs/Pleura: Small to moderate right pleural effusion. Patchy and consolidative opacities throughout the right lower lobe. Small left pleural effusion. Adjacent compressive atelectasis cells linear atelectasis in the lingula and left lower lobe. Breathing motion artifact partially obscures evaluation of the lung bases. No pulmonary edema. The trachea and mainstem bronchi are patent. Upper Abdomen: No acute findings. Musculoskeletal: Exaggerated thoracic kyphosis. Degenerative change in the spine. There are no acute or suspicious osseous abnormalities. Review of the MIP images confirms the above findings. IMPRESSION: 1. Positive for acute PE in the right lower and middle lobe. Mild right heart strain with straightening of the intraventricular septum and RV to LV ratio of 1.16. 2. Small to moderate right and small left pleural effusions. Patchy and consolidative opacities in the right lower lobe may be atelectasis or pneumonia. Distribution not classic for pulmonary infarct. Breathing motion artifact through the bases partially obscures evaluation. Aortic Atherosclerosis (ICD10-I70.0). Critical Value/emergent results were called by telephone at the time of interpretation on 08/31/2019 at 1:53 am to Dr Leeanne Mannan , who verbally acknowledged these results. Electronically Signed   By: Keith Rake M.D.   On: 08/31/2019 01:54   DG Chest Port 1 View  Result Date: 08/30/2019 CLINICAL DATA:  68 year old female with history of ongoing shortness of breath and chest pain. EXAM: PORTABLE CHEST 1 VIEW COMPARISON:  Chest x-ray  09/13/2015. FINDINGS: Lung volumes are low with bibasilar opacities which may reflect areas of atelectasis and/or consolidation (right greater than left). Moderate right and small left pleural effusions. No evidence of pulmonary edema. Heart size is normal. Upper mediastinal contours are within normal limits. No pneumothorax. IMPRESSION: 1. Low lung volumes with areas of atelectasis and/or consolidation in the in the lung bases bilaterally, with moderate right and small left pleural effusions. Electronically Signed   By: Vinnie Langton M.D.   On: 08/30/2019 20:42   ECHOCARDIOGRAM COMPLETE  Result Date: 08/31/2019   ECHOCARDIOGRAM REPORT   Patient Name:   Anne [name] Date of Exam: 08/31/2019 Medical Rec #:  [id number]              Height:       66.0 in Accession #:    [id number]             Weight:       182.0 lb Date of Birth:  02-Aug-1951               BSA:          1.92 m Patient Age:    57 years               BP:           129/70 mmHg Patient Gender: F                      HR:           69 bpm. Exam Location:  Inpatient Procedure: 2D Echo Indications:    Atrial Fibrillation 427.31 / I48.91  History:  Patient has no prior history of Echocardiogram examinations.                 Acute respiratory failure due to COVID-19.  Sonographer:    Vikki Ports Turrentine Referring Phys: [id number] Rise Patience  Sonographer Comments: Acute respiratory failure due to COVID-19 IMPRESSIONS  1. Left ventricular ejection fraction, by visual estimation, is 60 to 65%. The left ventricle has normal function. There is moderately increased left ventricular hypertrophy.  2. Left ventricular diastolic parameters are consistent with Grade I diastolic dysfunction (impaired relaxation).  3. The left ventricle has no regional wall motion abnormalities.  4. Global right ventricle has normal systolic function.The right ventricular size is normal. No increase in right ventricular wall thickness.  5. Left atrial size was  moderately dilated.  6. Right atrial size was normal.  7. The mitral valve is abnormal. Mild mitral valve regurgitation.  8. The tricuspid valve is grossly normal.  9. The aortic valve is tricuspid. Aortic valve regurgitation is not visualized. 10. The pulmonic valve was grossly normal. Pulmonic valve regurgitation is not visualized. 11. Normal pulmonary artery systolic pressure. FINDINGS  Left Ventricle: Left ventricular ejection fraction, by visual estimation, is 60 to 65%. The left ventricle has normal function. The left ventricle has no regional wall motion abnormalities. There is moderately increased left ventricular hypertrophy. Left ventricular diastolic parameters are consistent with Grade I diastolic dysfunction (impaired relaxation). Indeterminate filling pressures. Right Ventricle: The right ventricular size is normal. No increase in right ventricular wall thickness. Global RV systolic function is has normal systolic function. The tricuspid regurgitant velocity is 2.37 m/s, and with an assumed right atrial pressure  of 3 mmHg, the estimated right ventricular systolic pressure is normal at 25.4 mmHg. Left Atrium: Left atrial size was moderately dilated. Right Atrium: Right atrial size was normal in size Pericardium: There is no evidence of pericardial effusion. Mitral Valve: The mitral valve is abnormal. There is mild thickening of the mitral valve leaflet(s). Mild mitral valve regurgitation. Tricuspid Valve: The tricuspid valve is grossly normal. Tricuspid valve regurgitation is mild. Aortic Valve: The aortic valve is tricuspid. Aortic valve regurgitation is not visualized. Pulmonic Valve: The pulmonic valve was grossly normal. Pulmonic valve regurgitation is not visualized. Pulmonic regurgitation is not visualized. Aorta: The aortic root and ascending aorta are structurally normal, with no evidence of dilitation. IAS/Shunts: No atrial level shunt detected by color flow Doppler.  LEFT VENTRICLE PLAX 2D  LVIDd:         4.10 cm  Diastology LVIDs:         2.60 cm  LV e' lateral:   9.36 cm/s LV PW:         1.20 cm  LV E/e' lateral: 5.9 LV IVS:        1.20 cm  LV e' medial:    7.83 cm/s LVOT diam:     2.00 cm  LV E/e' medial:  7.0 LV SV:         50 ml LV SV Index:   25.01 LVOT Area:     3.14 cm  RIGHT VENTRICLE RV S prime:     14.30 cm/s TAPSE (M-mode): 1.9 cm LEFT ATRIUM             Index       RIGHT ATRIUM           Index LA diam:        4.40 cm 2.29 cm/m  RA Area:     18.50  cm LA Vol (A2C):   35.7 ml 18.58 ml/m RA Volume:   51.10 ml  26.60 ml/m LA Vol (A4C):   77.2 ml 40.18 ml/m LA Biplane Vol: 55.1 ml 28.68 ml/m  AORTIC VALVE LVOT Vmax:   112.00 cm/s LVOT Vmean:  80.300 cm/s LVOT VTI:    0.222 m  AORTA Ao Root diam: 2.70 cm MITRAL VALVE                        TRICUSPID VALVE MV Area (PHT): 4.21 cm             TR Peak grad:   22.4 mmHg MV PHT:        52.20 msec           TR Vmax:        241.00 cm/s MV Decel Time: 180 msec MV E velocity: 54.80 cm/s 103 cm/s  SHUNTS MV A velocity: 63.00 cm/s 70.3 cm/s Systemic VTI:  0.22 m MV E/A ratio:  0.87       1.5       Systemic Diam: 2.00 cm  Lyman Bishop MD Electronically signed by Lyman Bishop MD Signature Date/Time: 08/31/2019/12:24:48 PM    Final     Procedures Procedures (including critical care time)  Medications Ordered in ED Medications  acetaminophen (TYLENOL) tablet 650 mg (650 mg Oral Given 08/31/19 0317)    Or  acetaminophen (TYLENOL) suppository 650 mg ( Rectal See Alternative 08/31/19 0317)  ondansetron (ZOFRAN) tablet 4 mg (has no administration in time range)    Or  ondansetron (ZOFRAN) injection 4 mg (has no administration in time range)  remdesivir 200 mg in sodium chloride 0.9% 250 mL IVPB (0 mg Intravenous Stopped 08/31/19 0541)    Followed by  remdesivir 100 mg in sodium chloride 0.9 % 100 mL IVPB (has no administration in time range)  dexamethasone (DECADRON) injection 6 mg (6 mg Intravenous Given 08/31/19 0318)  heparin ADULT  infusion 100 units/mL (25000 units/252mL sodium chloride 0.45%) (1,550 Units/hr Intravenous Rate/Dose Change 08/31/19 1450)  lactated ringers bolus 1,000 mL (0 mLs Intravenous Stopped 08/30/19 2248)  iohexol (OMNIPAQUE) 350 MG/ML injection 75 mL (75 mLs Intravenous Contrast Given 08/31/19 0106)  tocilizumab (ACTEMRA) 8 mg/kg = 660 mg in sodium chloride 0.9 % 100 mL infusion (0 mg/kg  82.6 kg Intravenous Stopped 08/31/19 0425)  heparin bolus via infusion 4,000 Units (4,000 Units Intravenous Bolus from Bag 08/31/19 0421)  potassium chloride SA (KLOR-CON) CR tablet 20 mEq (20 mEq Oral Given 08/31/19 1450)  heparin bolus via infusion 2,000 Units (2,000 Units Intravenous Bolus from Bag 08/31/19 1449)    ED Course  I have reviewed the triage vital signs and the nursing notes.  Pertinent labs & imaging results that were available during my care of the patient were reviewed by me and considered in my medical decision making (see chart for details).    MDM Rules/Calculators/A&P                      Analyse Eizabeth Zacarias is a 68 y.o. female with a medical history of asthma, gerd who presents to the ED for right sided chest pain, hemoptysis and shortness of breath. She reports recently was at [facility] and was diagnosed with pneumonia but "I was not treated with antibiotics". She states although did have a covid test upon discharge and was notified that it was positive. Shortness of breath that worsened today and also started having  right sided chest pain and hemoptysis   HPI and physical exam as above. Female who appears stated age, mild acute distress. Comfortable at rest on room air, awake, alert, hemodynamically stable, febrile. Low suspicion for acs (troponin is mildly elevated, non ischemic ecg). Pending a cta to rule out PE. I have a high suspicion for PE, she is hemodynamically stable at this time. Cxr demonstrates bibasilar consolidations consistent with covid.  She has a leukocytosis with left  shift, consistent with covid process. She does have an anion gap without evidence of acidosis. Possibly due to starvation ketosis as she reports not eating or drinking well for the past week.  Pt care was handed off to [place] at 0001.  Complete history and physical and current plan have been communicated.  Please refer to their note for the remainder of ED care and ultimate disposition. The above statements and care plan were discussed with my attending physician Dr Wyvonnia Dusky who voiced agreement with plan.       Final Clinical Impression(s) / ED Diagnoses Final diagnoses:  [id number]    Rx / DC Orders ED Discharge Orders    None       Jahron Hunsinger, Lovena Le, MD 08/31/19 1731    Ezequiel Essex, MD 09/01/19 0840    Ezequiel Essex, MD 09/11/19 [phone]

## 2019-08-30 NOTE — ED Triage Notes (Signed)
Pt arrives POV for eval of ongoing SOB and chest pain. Pt reports she was recently dx'd w/ Covid and has just felt worse since that time. Pt reports hemoptysis starting yesterday. Hx of asthma, no thinners, Tachypneic to the 30s in triage. Moderate  respiratory effort

## 2019-08-31 ENCOUNTER — Inpatient Hospital Stay (HOSPITAL_COMMUNITY): Payer: Medicare HMO

## 2019-08-31 ENCOUNTER — Encounter (HOSPITAL_COMMUNITY): Payer: Self-pay | Admitting: Internal Medicine

## 2019-08-31 DIAGNOSIS — U071 COVID-19: Secondary | ICD-10-CM | POA: Diagnosis not present

## 2019-08-31 DIAGNOSIS — J9600 Acute respiratory failure, unspecified whether with hypoxia or hypercapnia: Secondary | ICD-10-CM | POA: Diagnosis not present

## 2019-08-31 DIAGNOSIS — I361 Nonrheumatic tricuspid (valve) insufficiency: Secondary | ICD-10-CM | POA: Diagnosis not present

## 2019-08-31 DIAGNOSIS — J9601 Acute respiratory failure with hypoxia: Secondary | ICD-10-CM | POA: Diagnosis not present

## 2019-08-31 DIAGNOSIS — J1282 Pneumonia due to coronavirus disease 2019: Secondary | ICD-10-CM | POA: Diagnosis not present

## 2019-08-31 DIAGNOSIS — I340 Nonrheumatic mitral (valve) insufficiency: Secondary | ICD-10-CM | POA: Diagnosis not present

## 2019-08-31 DIAGNOSIS — J189 Pneumonia, unspecified organism: Secondary | ICD-10-CM | POA: Diagnosis not present

## 2019-08-31 DIAGNOSIS — E876 Hypokalemia: Secondary | ICD-10-CM | POA: Diagnosis not present

## 2019-08-31 DIAGNOSIS — Z79899 Other long term (current) drug therapy: Secondary | ICD-10-CM | POA: Diagnosis not present

## 2019-08-31 DIAGNOSIS — I10 Essential (primary) hypertension: Secondary | ICD-10-CM | POA: Diagnosis not present

## 2019-08-31 DIAGNOSIS — T502X5A Adverse effect of carbonic-anhydrase inhibitors, benzothiadiazides and other diuretics, initial encounter: Secondary | ICD-10-CM | POA: Diagnosis present

## 2019-08-31 DIAGNOSIS — K219 Gastro-esophageal reflux disease without esophagitis: Secondary | ICD-10-CM | POA: Diagnosis not present

## 2019-08-31 DIAGNOSIS — M810 Age-related osteoporosis without current pathological fracture: Secondary | ICD-10-CM | POA: Diagnosis present

## 2019-08-31 DIAGNOSIS — R0602 Shortness of breath: Secondary | ICD-10-CM | POA: Diagnosis not present

## 2019-08-31 DIAGNOSIS — J45909 Unspecified asthma, uncomplicated: Secondary | ICD-10-CM | POA: Diagnosis not present

## 2019-08-31 DIAGNOSIS — I2699 Other pulmonary embolism without acute cor pulmonale: Secondary | ICD-10-CM | POA: Diagnosis not present

## 2019-08-31 DIAGNOSIS — J90 Pleural effusion, not elsewhere classified: Secondary | ICD-10-CM | POA: Diagnosis not present

## 2019-08-31 DIAGNOSIS — R042 Hemoptysis: Secondary | ICD-10-CM | POA: Diagnosis not present

## 2019-08-31 LAB — COMPREHENSIVE METABOLIC PANEL
ALT: 19 U/L (ref 0–44)
AST: 24 U/L (ref 15–41)
Albumin: 1.9 g/dL — ABNORMAL LOW (ref 3.5–5.0)
Alkaline Phosphatase: 72 U/L (ref 38–126)
Anion gap: 14 (ref 5–15)
BUN: 11 mg/dL (ref 8–23)
CO2: 25 mmol/L (ref 22–32)
Calcium: 8.1 mg/dL — ABNORMAL LOW (ref 8.9–10.3)
Chloride: 99 mmol/L (ref 98–111)
Creatinine, Ser: 0.82 mg/dL (ref 0.44–1.00)
GFR calc Af Amer: >60 mL/min (ref >60)
GFR calc non Af Amer: >60 mL/min (ref >60)
Glucose, Bld: 108 mg/dL — ABNORMAL HIGH (ref 70–99)
Potassium: 3.1 mmol/L — ABNORMAL LOW (ref 3.5–5.1)
Sodium: 138 mmol/L (ref 135–145)
Total Bilirubin: 1 mg/dL (ref 0.3–1.2)
Total Protein: 7.1 g/dL (ref 6.5–8.1)

## 2019-08-31 LAB — CBC WITH DIFFERENTIAL/PLATELET
Abs Immature Granulocytes: 0.57 10*3/uL — ABNORMAL HIGH (ref 0.00–0.07)
Basophils Absolute: 0.1 10*3/uL (ref 0.0–0.1)
Basophils Relative: 0 %
Eosinophils Absolute: 0.2 10*3/uL (ref 0.0–0.5)
Eosinophils Relative: 1 %
HCT: 34.4 % — ABNORMAL LOW (ref 36.0–46.0)
Hemoglobin: 10.9 g/dL — ABNORMAL LOW (ref 12.0–15.0)
Immature Granulocytes: 5 %
Lymphocytes Relative: 9 %
Lymphs Abs: 1.2 10*3/uL (ref 0.7–4.0)
MCH: 30.4 pg (ref 26.0–34.0)
MCHC: 31.7 g/dL (ref 30.0–36.0)
MCV: 95.8 fL (ref 80.0–100.0)
Monocytes Absolute: 0.4 10*3/uL (ref 0.1–1.0)
Monocytes Relative: 3 %
Neutro Abs: 10.4 10*3/uL — ABNORMAL HIGH (ref 1.7–7.7)
Neutrophils Relative %: 82 %
Platelets: 564 10*3/uL — ABNORMAL HIGH (ref 150–400)
RBC: 3.59 MIL/uL — ABNORMAL LOW (ref 3.87–5.11)
RDW: 13.8 % (ref 11.5–15.5)
WBC: 12.8 10*3/uL — ABNORMAL HIGH (ref 4.0–10.5)
nRBC: 0 % (ref 0.0–0.2)

## 2019-08-31 LAB — HEPARIN LEVEL (UNFRACTIONATED)
Heparin Unfractionated: <0.1 IU/mL — ABNORMAL LOW (ref 0.30–0.70)
Heparin Unfractionated: 0.2 IU/mL — ABNORMAL LOW (ref 0.30–0.70)

## 2019-08-31 LAB — ECHOCARDIOGRAM COMPLETE
Height: 66 in
Weight: 2912 oz

## 2019-08-31 LAB — HIV ANTIBODY (ROUTINE TESTING W REFLEX): HIV Screen 4th Generation wRfx: NONREACTIVE

## 2019-08-31 LAB — PROCALCITONIN: Procalcitonin: 0.18 ng/mL

## 2019-08-31 LAB — C-REACTIVE PROTEIN: CRP: 23 mg/dL — ABNORMAL HIGH (ref <1.0)

## 2019-08-31 LAB — TROPONIN I (HIGH SENSITIVITY)
Troponin I (High Sensitivity): 22 ng/L — ABNORMAL HIGH (ref <18)
Troponin I (High Sensitivity): 22 ng/L — ABNORMAL HIGH (ref <18)

## 2019-08-31 LAB — FERRITIN: Ferritin: 572 ng/mL — ABNORMAL HIGH (ref 11–307)

## 2019-08-31 MED ORDER — ACETAMINOPHEN 650 MG RE SUPP: 650.0000 mg | Freq: Four times a day (QID) | RECTAL | Status: DC | PRN

## 2019-08-31 MED ORDER — POTASSIUM CHLORIDE CRYS ER 20 MEQ PO TBCR
20.0000 meq | EXTENDED_RELEASE_TABLET | Freq: Once | ORAL | Status: AC
  Administered 2019-08-31: 20 meq via ORAL
  Filled 2019-08-31: qty 1

## 2019-08-31 MED ORDER — ENOXAPARIN SODIUM 40 MG/0.4ML ~~LOC~~ SOLN: 40.0000 mg | Freq: Every day | SUBCUTANEOUS | Status: DC

## 2019-08-31 MED ORDER — TOCILIZUMAB 400 MG/20ML IV SOLN
8.0000 mg/kg | Freq: Once | INTRAVENOUS | Status: AC
  Administered 2019-08-31: 660 mg via INTRAVENOUS
  Filled 2019-08-31 (×2): qty 33

## 2019-08-31 MED ORDER — SODIUM CHLORIDE 0.9 % IV SOLN
200.0000 mg | Freq: Once | INTRAVENOUS | Status: AC
  Administered 2019-08-31: 200 mg via INTRAVENOUS
  Filled 2019-08-31 (×2): qty 40

## 2019-08-31 MED ORDER — IOHEXOL 350 MG/ML SOLN
75.0000 mL | Freq: Once | INTRAVENOUS | Status: AC | PRN
  Administered 2019-08-31: 75 mL via INTRAVENOUS

## 2019-08-31 MED ORDER — HEPARIN BOLUS VIA INFUSION
2000.0000 [IU] | Freq: Once | INTRAVENOUS | Status: AC
  Administered 2019-09-01: 2000 [IU] via INTRAVENOUS
  Filled 2019-08-31: qty 2000

## 2019-08-31 MED ORDER — DEXAMETHASONE SODIUM PHOSPHATE 10 MG/ML IJ SOLN
6.0000 mg | Freq: Every day | INTRAMUSCULAR | Status: DC
  Administered 2019-08-31 – 2019-09-05 (×6): 6 mg via INTRAVENOUS
  Filled 2019-08-31 (×5): qty 1

## 2019-08-31 MED ORDER — HEPARIN BOLUS VIA INFUSION
4000.0000 [IU] | Freq: Once | INTRAVENOUS | Status: AC
  Administered 2019-08-31: 4000 [IU] via INTRAVENOUS
  Filled 2019-08-31: qty 4000

## 2019-08-31 MED ORDER — HEPARIN (PORCINE) 25000 UT/250ML-% IV SOLN
1250.0000 [IU]/h | INTRAVENOUS | Status: DC
  Administered 2019-08-31: 1300 [IU]/h via INTRAVENOUS
  Administered 2019-09-01 – 2019-09-02 (×2): 1600 [IU]/h via INTRAVENOUS
  Administered 2019-09-02: 1250 [IU]/h via INTRAVENOUS
  Filled 2019-08-31 (×4): qty 250

## 2019-08-31 MED ORDER — ONDANSETRON HCL 4 MG PO TABS: 4.0000 mg | ORAL_TABLET | Freq: Four times a day (QID) | ORAL | Status: DC | PRN

## 2019-08-31 MED ORDER — ACETAMINOPHEN 325 MG PO TABS
650.0000 mg | ORAL_TABLET | Freq: Four times a day (QID) | ORAL | Status: DC | PRN
  Administered 2019-08-31 – 2019-09-05 (×12): 650 mg via ORAL
  Filled 2019-08-31 (×12): qty 2

## 2019-08-31 MED ORDER — HEPARIN BOLUS VIA INFUSION
2000.0000 [IU] | Freq: Once | INTRAVENOUS | Status: AC
  Administered 2019-08-31: 2000 [IU] via INTRAVENOUS
  Filled 2019-08-31: qty 2000

## 2019-08-31 MED ORDER — ONDANSETRON HCL 4 MG/2ML IJ SOLN
4.0000 mg | Freq: Four times a day (QID) | INTRAMUSCULAR | Status: DC | PRN
  Administered 2019-09-01 – 2019-09-04 (×6): 4 mg via INTRAVENOUS
  Filled 2019-08-31 (×6): qty 2

## 2019-08-31 MED ORDER — SODIUM CHLORIDE 0.9 % IV SOLN
100.0000 mg | Freq: Every day | INTRAVENOUS | Status: AC
  Administered 2019-09-01 – 2019-09-04 (×4): 100 mg via INTRAVENOUS
  Filled 2019-08-31: qty 100
  Filled 2019-08-31 (×3): qty 20

## 2019-08-31 NOTE — ED Notes (Signed)
Unable to check pt pulse ox while ambulating. Pt became very weak upon standing, RR of 40, O2 sats went to 89% on room air, and the patient began coughing.

## 2019-08-31 NOTE — Progress Notes (Signed)
  Echocardiogram 2D Echocardiogram has been performed.  Nolyn Eilert A Sharlena Kristensen 08/31/2019, 11:13 AM

## 2019-08-31 NOTE — Progress Notes (Signed)
ANTICOAGULATION CONSULT NOTE  Pharmacy Consult for Heparin Indication: pulmonary embolus  No Known Allergies  Patient Measurements: Height: 5\' 6"  (167.6 cm) Weight: 182 lb (82.6 kg) IBW/kg (Calculated) : 59.3 Heparin Dosing Weight: 75 kg  Vital Signs: BP: 131/80 (12/31 2300) Pulse Rate: 75 (12/31 2300)  Labs: Recent Labs    08/30/19 1911 08/30/19 2136 08/31/19 0540 08/31/19 0801 08/31/19 1005 08/31/19 2246  HGB 12.6  --  10.9*  --   --   --   HCT 39.5  --  34.4*  --   --   --   PLT 611*  --  564*  --   --   --   HEPARINUNFRC  --   --   --   --  <0.10* 0.20*  CREATININE 0.98  --  0.82  --   --   --   TROPONINIHS 29* 23* 22* 22*  --   --     Estimated Creatinine Clearance: 71.1 mL/min (by C-G formula based on SCr of 0.82 mg/dL).  Assessment: 68 y.o. female with PE for heparin  Goal of Therapy:  Heparin level 0.3-0.7 units/ml Monitor platelets by anticoagulation protocol: Yes   Plan:  Heparin 2000 units IV bolus, then increase heparin 1800 units/hr Check heparin level in 8 hours.   Caryl Pina 08/31/2019,11:36 PM

## 2019-08-31 NOTE — ED Notes (Signed)
Two RNs attempted to start 2nd IV, unsuccessful.  Will consult IV team

## 2019-08-31 NOTE — Progress Notes (Signed)
ANTICOAGULATION CONSULT NOTE - Initial Consult  Pharmacy Consult for Heparin Indication: pulmonary embolus  No Known Allergies  Patient Measurements: Height: 5\' 6"  (167.6 cm) Weight: 182 lb (82.6 kg) IBW/kg (Calculated) : 59.3 Heparin Dosing Weight: 75 kg  Vital Signs: Temp: 99.5 F (37.5 C) (12/30 1928) Temp Source: Oral (12/30 1928) BP: 144/69 (12/31 0051) Pulse Rate: 86 (12/31 0141)  Labs: Recent Labs    08/30/19 1911 08/30/19 2136  HGB 12.6  --   HCT 39.5  --   PLT 611*  --   CREATININE 0.98  --   TROPONINIHS 29* 23*    Estimated Creatinine Clearance: 59.5 mL/min (by C-G formula based on SCr of 0.98 mg/dL).   Medical History: Past Medical History:  Diagnosis Date  . Asthma   . GERD (gastroesophageal reflux disease)     Medications:  No current facility-administered medications on file prior to encounter.   Current Outpatient Medications on File Prior to Encounter  Medication Sig Dispense Refill  . acetaminophen (TYLENOL) 500 MG tablet Take 500 mg by mouth every 6 (six) hours as needed (pain).    Marland Kitchen albuterol (PROVENTIL HFA;VENTOLIN HFA) 108 (90 BASE) MCG/ACT inhaler Inhale 2 puffs into the lungs every 6 (six) hours as needed for wheezing.    Marland Kitchen amLODipine (NORVASC) 10 MG tablet Take 10 mg by mouth daily.    . calcium carbonate (TUMS - DOSED IN MG ELEMENTAL CALCIUM) 500 MG chewable tablet Chew 1 tablet by mouth daily.    . chlorthalidone (HYGROTON) 25 MG tablet Take 25 mg by mouth daily.    Marland Kitchen docusate sodium (COLACE) 100 MG capsule Take 200 mg by mouth at bedtime.    . famotidine (PEPCID) 20 MG tablet Take 20 mg by mouth daily.    Marland Kitchen HYDROcodone-acetaminophen (NORCO/VICODIN) 5-325 MG per tablet Take 1 tablet by mouth every 6 (six) hours as needed for pain. 7 tablet 0  . hydroxyurea (HYDREA) 500 MG capsule Take 500-1,000 mg by mouth See admin instructions. Pt alternates 50 mg every other day, with 100 mg    . ibuprofen (ADVIL) 200 MG tablet Take 400 mg by mouth  every 6 (six) hours as needed (pain).    Marland Kitchen lansoprazole (PREVACID) 30 MG capsule Take 30 mg by mouth daily.    . Multiple Vitamin (MULTIVITAMIN WITH MINERALS) TABS Take 1 tablet by mouth daily.       Assessment: 68 y.o. female with PE for heparin  Goal of Therapy:  Heparin level 0.3-0.7 units/ml Monitor platelets by anticoagulation protocol: Yes   Plan:  Heparin 4000 units IV bolus, then start heparin 1300 units/hr Check heparin level in 6 hours.   Caryl Pina 08/31/2019,2:00 AM

## 2019-08-31 NOTE — ED Notes (Signed)
Patient awakened from her nap and reports she feels much better. Reports increased energy and no longer is experiencing chest pain. She is at the side of her bed eating her lunch. Has no complaints.

## 2019-08-31 NOTE — ED Notes (Signed)
+  tele  Breakfast ordered 

## 2019-08-31 NOTE — ED Notes (Signed)
Lunch Tray Ordered @ 1108.  

## 2019-08-31 NOTE — Progress Notes (Signed)
ANTICOAGULATION CONSULT NOTE  Pharmacy Consult for Heparin Indication: pulmonary embolus  No Known Allergies  Patient Measurements: Height: 5\' 6"  (167.6 cm) Weight: 182 lb (82.6 kg) IBW/kg (Calculated) : 59.3 Heparin Dosing Weight: 75 kg  Vital Signs: BP: 125/78 (12/31 1315) Pulse Rate: 64 (12/31 1315)  Labs: Recent Labs    08/30/19 1911 08/30/19 2136 08/31/19 0540 08/31/19 0801 08/31/19 1005  HGB 12.6  --  10.9*  --   --   HCT 39.5  --  34.4*  --   --   PLT 611*  --  564*  --   --   HEPARINUNFRC  --   --   --   --  <0.10*  CREATININE 0.98  --  0.82  --   --   TROPONINIHS 29* 23* 22* 22*  --     Estimated Creatinine Clearance: 71.1 mL/min (by C-G formula based on SCr of 0.82 mg/dL).  Assessment: 68 y.o. female continues on IV heparin for PE. Initial heparin level is undetectable. Hgb is down to 10.9 and platelets are elevated. No bleeding noted.   Goal of Therapy:  Heparin level 0.3-0.7 units/ml Monitor platelets by anticoagulation protocol: Yes   Plan:  Heparin bolus 2000 units IV x 1 Increase heparin gtt to 1550 units/hr Check a 6 hr heparin level Daily heparin level and CBC  Salome Arnt, PharmD, BCPS Clinical Pharmacist Please see AMION for all pharmacy numbers 08/31/2019 2:50 PM

## 2019-08-31 NOTE — H&P (Addendum)
History and Physical    Anne [name] [id number] DOB: 1951/07/28 DOA: 08/30/2019  PCP: Greig Right, MD  Patient coming from: Home.  Chief Complaint: Shortness of breath.  HPI: Anne [name] is a 68 y.o. female with no significant past medical history presents to the ER because of worsening shortness of breath.  Patient states she had a right-sided pleuritic type of chest pain which was ongoing for last 1 week and had gone to the ER at [facility] about a week ago and was diagnosed with COVID-19 infection.  Patient at that time was not placed on any medication.  Since patient became more short of breath since then patient decided to come to the ER today.  Patient also has benign subjective feeling of fever chills denies any nausea vomiting or diarrhea.  ED Course: In the ER patient is febrile with temperature 100.9 F requiring 2 L oxygen chest x-ray showing bilateral infiltrates and pleural effusion.  Patient's COVID-19 infection positive results are available in the media section of the chart.  Labs show potassium 3.4 albumin 2.2 troponin high since he was 29 and 23.  CRP 27.9 WBC 18.1 D-dimer was 10.3.  EKG was showing sinus tachycardia.  At the time of this dictation patient CT angiogram was pending.  Patient started on IV Decadron remdesivir admitted for hypoxic respiratory failure secondary to Covid infection.  Review of Systems: As per HPI, rest all negative.   Past Medical History:  Diagnosis Date  . Asthma   . GERD (gastroesophageal reflux disease)     Past Surgical History:  Procedure Laterality Date  . APPENDECTOMY       reports that she has never smoked. She has never used smokeless tobacco. She reports that she does not drink alcohol or use drugs.  No Known Allergies  Family History  Problem Relation Age of Onset  . Diabetes Mellitus II Other     Prior to Admission medications   Medication Sig Start Date End Date Taking? Authorizing  Provider  albuterol (PROVENTIL HFA;VENTOLIN HFA) 108 (90 BASE) MCG/ACT inhaler Inhale 2 puffs into the lungs every 6 (six) hours as needed for wheezing.    [provider]  calcium carbonate (TUMS - DOSED IN MG ELEMENTAL CALCIUM) 500 MG chewable tablet Chew 1 tablet by mouth daily.    [provider]  HYDROcodone-acetaminophen (NORCO/VICODIN) 5-325 MG per tablet Take 1 tablet by mouth every 6 (six) hours as needed for pain. 11/21/12   Montine Circle, PA-C  Multiple Vitamin (MULTIVITAMIN WITH MINERALS) TABS Take 1 tablet by mouth daily.    [provider]    Physical Exam: Constitutional: Moderately built and nourished. Vitals:   08/30/19 1727 08/30/19 1755 08/30/19 1928 08/31/19 0051  BP: 126/83  (!) 155/75 (!) 144/69  Pulse: (!) 116  (!) 106 94  Resp:   (!) 22 (!) 25  Temp: (!) 100.9 F (38.3 C)  99.5 F (37.5 C)   TempSrc: Oral  Oral   SpO2: 98%  92% 97%  Weight:  82.6 kg    Height:  5\' 6"  (1.676 m)     Eyes: Anicteric no pallor. ENMT: No discharge from the ears eyes nose or mouth. Neck: No mass felt.  No neck rigidity. Respiratory: No rhonchi or crepitations. Cardiovascular: S1-S2 heard. Abdomen: Soft nontender bowel sound present. Musculoskeletal: No edema.  No joint effusion. Skin: No rash. Neurologic: Alert awake oriented to time place and person.  Moves all extremities. Psychiatric: Appears normal per normal  affect.   Labs on Admission: I have personally reviewed following labs and imaging studies  CBC: Recent Labs  Lab 08/30/19 1911  WBC 18.1*  NEUTROABS 14.2*  HGB 12.6  HCT 39.5  MCV 95.0  PLT [id number]*   Basic Metabolic Panel: Recent Labs  Lab 08/30/19 1911  NA 140  K 3.4*  CL 98  CO2 22  GLUCOSE 101*  BUN 11  CREATININE 0.98  CALCIUM 8.8*   GFR: Estimated Creatinine Clearance: 59.5 mL/min (by C-G formula based on SCr of 0.98 mg/dL). Liver Function Tests: Recent Labs  Lab 08/30/19 1911  AST 28  ALT 20  ALKPHOS 96    BILITOT 1.0  PROT 11.5*  ALBUMIN 2.2*   No results for input(s): LIPASE, AMYLASE in the last 168 hours. No results for input(s): AMMONIA in the last 168 hours. Coagulation Profile: No results for input(s): INR, PROTIME in the last 168 hours. Cardiac Enzymes: No results for input(s): CKTOTAL, CKMB, CKMBINDEX, TROPONINI in the last 168 hours. BNP (last 3 results) No results for input(s): PROBNP in the last 8760 hours. HbA1C: No results for input(s): HGBA1C in the last 72 hours. CBG: No results for input(s): GLUCAP in the last 168 hours. Lipid Profile: Recent Labs    08/30/19 1911  TRIG 143   Thyroid Function Tests: No results for input(s): TSH, T4TOTAL, FREET4, T3FREE, THYROIDAB in the last 72 hours. Anemia Panel: Recent Labs    08/30/19 2014  FERRITIN 634*   Urine analysis: No results found for: COLORURINE, APPEARANCEUR, LABSPEC, PHURINE, GLUCOSEU, HGBUR, BILIRUBINUR, KETONESUR, PROTEINUR, UROBILINOGEN, NITRITE, LEUKOCYTESUR Sepsis Labs: @LABRCNTIP (procalcitonin:4,lacticidven:4) )No results found for this or any previous visit (from the past 240 hour(s)).   Radiological Exams on Admission: DG Chest Port 1 View  Result Date: 08/30/2019 CLINICAL DATA:  68 year old female with history of ongoing shortness of breath and chest pain. EXAM: PORTABLE CHEST 1 VIEW COMPARISON:  Chest x-ray 09/13/2015. FINDINGS: Lung volumes are low with bibasilar opacities which may reflect areas of atelectasis and/or consolidation (right greater than left). Moderate right and small left pleural effusions. No evidence of pulmonary edema. Heart size is normal. Upper mediastinal contours are within normal limits. No pneumothorax. IMPRESSION: 1. Low lung volumes with areas of atelectasis and/or consolidation in the in the lung bases bilaterally, with moderate right and small left pleural effusions. Electronically Signed   By: Vinnie Langton M.D.   On: 08/30/2019 20:42    EKG: Independently reviewed.   Sinus tachycardia.  Assessment/Plan Principal Problem:   Acute respiratory failure due to COVID-19 ([place])    1. Acute respiratory failure with hypoxia secondary to COVID-19 infection for which I have started patient IV Decadron and IV remdesivir.  Since patient has significant elevated CRP and is hypoxic with chest x-ray showing infiltrate I discussed with patient about using Actemra and also assist with off label use and also the contraindications patient is agreeable to use. 2. Mild hypokalemia replace and recheck. 3. Bilateral pleural effusion cause not clear.  Patient does have low albumin levels which could be contributing.  We need to check if patient has any urine proteinuria or liver pathology for the low albumin.  Given that patient has acute respiratory failure with hypoxia with COVID-19 infection will need inpatient status.  Addendum -CT angiogram of the chest shows pulmonary embolism with mild strain pattern.  Patient is hemodynamically stable.  Will start patient on IV heparin check 2D echo check further cardiac markers and pulmonary embolism likely precipitated by Covid infection.  Does show pleural effusion for which will need further work-up.   DVT prophylaxis: Heparin infusion. Code Status: Full code. Family Communication: Discussed with patient. Disposition Plan: Home. Consults called: None. Admission status: Inpatient.   Rise Patience MD [facility] Pager [phone].  If 7PM-7AM, please contact night-coverage [email or web address] Password [email or web address]  08/31/2019, 1:27 AM

## 2019-08-31 NOTE — Progress Notes (Signed)
I agree with plan of care and history and physical as per my partner Dr. Hal Hope who admitted this patient this morning  68 year old female known history of asthma, reflux, HTN, osteoporosis came to ER because of worsening shortness of breath Went to [place] ED and was diagnosed with coronavirus infection T-max in the emergency room 100.9 requiring 2 L of oxygen CXR = bilateral infiltrates pleural effusion Ferritin 572 CRP 23 Procalcitonin 0.18 D-dimer 10.3 BNP 72.6  Potassium 3.1, WBC 12.8 CT chest positive for acute PE right middle and lower lobe right heart strain is straightening of IV septum small to moderate right and small left pleural effusions  Patient started on IV heparin echocardiogram ordered  Patient started also on remdesivir Decadron and counseled regarding Actemra as per admitting physician andx first dose given  O/e BP 129/70   Pulse 69   Temp 99.5 F (37.5 C) (Oral)   Resp 15   Ht 5\' 6"  (1.676 m)   Wt 82.6 kg   SpO2 96%   BMI 29.38 kg/m  Patient alert pleasant alert quiet voice no distress S1-S2 no murmur rub or gallop Chest clear No lower extremity edema Abdomen soft nontender no rebound no guarding  P  Continue poc as per orders was able to wean her down to 1 liter oxygen -If continues to have minimal oxygen requirements may be due to complete remdesivir at outpatient clinic  Verneita Griffes, MD [facility] 10:00 AM

## 2019-09-01 DIAGNOSIS — U071 COVID-19: Secondary | ICD-10-CM | POA: Diagnosis not present

## 2019-09-01 DIAGNOSIS — J9600 Acute respiratory failure, unspecified whether with hypoxia or hypercapnia: Secondary | ICD-10-CM | POA: Diagnosis not present

## 2019-09-01 LAB — CBC
HCT: 36 % (ref 36.0–46.0)
Hemoglobin: 10.7 g/dL — ABNORMAL LOW (ref 12.0–15.0)
MCH: 30.1 pg (ref 26.0–34.0)
MCHC: 29.7 g/dL — ABNORMAL LOW (ref 30.0–36.0)
MCV: 101.1 fL — ABNORMAL HIGH (ref 80.0–100.0)
Platelets: 506 10*3/uL — ABNORMAL HIGH (ref 150–400)
RBC: 3.56 MIL/uL — ABNORMAL LOW (ref 3.87–5.11)
RDW: 13.5 % (ref 11.5–15.5)
WBC: 14.1 10*3/uL — ABNORMAL HIGH (ref 4.0–10.5)
nRBC: 0 % (ref 0.0–0.2)

## 2019-09-01 LAB — HEPARIN LEVEL (UNFRACTIONATED)
Heparin Unfractionated: 0.77 IU/mL — ABNORMAL HIGH (ref 0.30–0.70)
Heparin Unfractionated: 0.76 IU/mL — ABNORMAL HIGH (ref 0.30–0.70)

## 2019-09-01 LAB — LACTIC ACID, PLASMA: Lactic Acid, Venous: 1.9 mmol/L (ref 0.5–1.9)

## 2019-09-01 NOTE — Progress Notes (Signed)
Hospitalist daily note   Anne [name] [id number] DOB: 1951-04-23 DOA: 08/30/2019  PCP: Greig Right, MD   Narrative:  69 year old female known history of asthma, reflux, HTN, osteoporosis came to ER because of worsening shortness of breath Went to [place] ED and was diagnosed with coronavirus infection T-max in the emergency room 100.9 requiring 2 L of oxygen  Data Reviewed:  CXR = bilateral infiltrates pleural effusion Ferritin 572 CRP 23-- Procalcitonin 0.18 D-dimer 10.3 BNP 72.6 CT chest positive for acute PE right middle and lower lobe right heart strain is straightening of IV septum small to moderate right and small left pleural effusions Echo 12/31 Ef 60-65% grd 1 DD  Assessment & Plan: Coronavirus 19 infection  Requested labs be done since this am have not yet been done Patient however stable remdisivir through 1/4, steroids through 1/9 Rpt labs when able Pulmonary embolism continue Heparin-monior trends and hemoglobin-transition to NOAC for at least 3 mo Leukocytosis Recheck cxr in am rule out PNA Check PCT Asthma  No family paitent stable needs to complete Remdisivir in house  Subjective: Awake alert in nad no focal deficit needing minimal oxygen Consultants:   none Procedures:   n Antimicrobials:   n    Objective: Vitals:   09/01/19 0645 09/01/19 0700 09/01/19 0715 09/01/19 0730  BP: 134/70 (!) 114/56 110/62 (!) 109/58  Pulse: 71 (!) 56 61 61  Resp: (!) 21 17 16 14   Temp:      TempSrc:      SpO2: 100% 100% 100% 100%  Weight:      Height:       No intake or output data in the 24 hours ending 09/01/19 0833 Filed Weights   08/30/19 1755  Weight: 82.6 kg    Examination: Awake alert in no distress On oxygen cta b no added sound no rales no rhonchi abd soft nt nd  No le edema  Scheduled Meds: . dexamethasone (DECADRON) injection  6 mg Intravenous Daily   Continuous Infusions: . heparin 1,800 Units/hr (09/01/19 0018)  .  remdesivir 100 mg in NS 100 mL       LOS: 1 day   Time spent:  [place], MD [facility]

## 2019-09-01 NOTE — ED Notes (Signed)
Lunch tray ordered 

## 2019-09-01 NOTE — Progress Notes (Signed)
ANTICOAGULATION CONSULT NOTE  Pharmacy Consult for Heparin Indication: pulmonary embolus  No Known Allergies  Patient Measurements: Height: 5\' 6"  (167.6 cm) Weight: 182 lb (82.6 kg) IBW/kg (Calculated) : 59.3 Heparin Dosing Weight: 75 kg  Vital Signs: BP: 122/76 (01/01 0800) Pulse Rate: 76 (01/01 1000)  Labs: Recent Labs    08/30/19 1911 08/30/19 2136 08/31/19 0540 08/31/19 0801 08/31/19 1005 08/31/19 2246 09/01/19 0800  HGB 12.6  --  10.9*  --   --   --  10.7*  HCT 39.5  --  34.4*  --   --   --  36.0  PLT 611*  --  564*  --   --   --  506*  HEPARINUNFRC  --   --   --   --  <0.10* 0.20* 0.77*  CREATININE 0.98  --  0.82  --   --   --   --   TROPONINIHS 29* 23* 22* 22*  --   --   --     Estimated Creatinine Clearance: 71.1 mL/min (by C-G formula based on SCr of 0.82 mg/dL).  Assessment: 69 y.o. female continues on IV heparin for acute PE.  Heparin level is now supra-therapeutic.  Confirmed with phlebotomist that lab was drawn appropriately.  No bleeding reported.  Goal of Therapy:  Heparin level 0.3-0.7 units/ml Monitor platelets by anticoagulation protocol: Yes   Plan:  Reduce heparin gtt to 1700 units/hr Check a 6 hr HL Daily heparin level and CBC  Zamzam Whinery D. Mina Marble, PharmD, BCPS, [place] 09/01/2019, 10:32 AM

## 2019-09-01 NOTE — Progress Notes (Signed)
ANTICOAGULATION CONSULT NOTE - Follow Up  Pharmacy Consult for Heparin Indication: pulmonary embolus  No Known Allergies  Patient Measurements: Height: 5\' 6"  (167.6 cm) Weight: 189 lb 2.5 oz (85.8 kg) IBW/kg (Calculated) : 59.3 Heparin Dosing Weight: 75 kg  Vital Signs: BP: 134/72 (01/01 1315) Pulse Rate: 74 (01/01 1315)  Labs: Recent Labs    08/30/19 1911 08/30/19 1911 08/30/19 2136 08/31/19 0540 08/31/19 0801 08/31/19 2246 09/01/19 0800 09/01/19 1700  HGB 12.6  --   --  10.9*  --   --  10.7*  --   HCT 39.5  --   --  34.4*  --   --  36.0  --   PLT 611*  --   --  564*  --   --  506*  --   HEPARINUNFRC  --    < >  --   --   --  0.20* 0.77* 0.76*  CREATININE 0.98  --   --  0.82  --   --   --   --   TROPONINIHS 29*  --  23* 22* 22*  --   --   --    < > = values in this interval not displayed.    Estimated Creatinine Clearance: 72.5 mL/min (by C-G formula based on SCr of 0.82 mg/dL).  Assessment: 69 y.o. female continues on IV heparin for acute PE.  Heparin level is remains slightly  supra-therapeutic.  No bleeding reported.  Goal of Therapy:  Heparin level 0.3-0.7 units/ml Monitor platelets by anticoagulation protocol: Yes   Plan:  Reduce heparin gtt to 1600 units/hr Next heparin level with AM labs. Daily heparin level and CBC  Gaelle Adriance, Pharm.D., BCPS Clinical Pharmacist  **Pharmacist phone directory can be found on [email or web address] listed under [place].  09/01/2019 5:39 PM

## 2019-09-01 NOTE — Progress Notes (Signed)
Anne [name] [id number] Admission Data: 09/01/2019 1500 Attending Provider: Nita Sells, MD  [id number], Olin Hauser, MD Consults/ Treatment Team:   Anne [name] is a 69 y.o. female patient admitted from ED awake, alert  & oriented  X 4,  Full Code, VSS - Blood pressure 115/62, pulse 77, temperature 97.8 F (36.6 C), temperature source Oral, resp. rate 19, height 5\' 6"  (1.676 m), weight 85.8 kg, SpO2 95 %., no c/o shortness of breath or distress noted. Tele placed and pt is currently running:normal sinus rhythm.   Allergies:  No Known Allergies   Past Medical History:  Diagnosis Date  . Asthma   . GERD (gastroesophageal reflux disease)         HTN    Pt orientation to unit, room and routine. Information packet given to patient.  Admission INP armband ID verified with patient/family, and in place. SR up x 2, fall risk assessment complete with Patient and family verbalizing understanding of risks associated with falls. Pt verbalizes an understanding of how to use the call bell and to call for help before getting out of bed.  Skin, clean-dry- intact without wounds    Will cont to monitor and assist as needed.  Sherren Kerns, RN 09/01/2019 1530

## 2019-09-02 ENCOUNTER — Inpatient Hospital Stay (HOSPITAL_COMMUNITY): Payer: Medicare HMO

## 2019-09-02 ENCOUNTER — Encounter (HOSPITAL_COMMUNITY): Payer: Self-pay | Admitting: Internal Medicine

## 2019-09-02 DIAGNOSIS — J189 Pneumonia, unspecified organism: Secondary | ICD-10-CM | POA: Diagnosis not present

## 2019-09-02 DIAGNOSIS — U071 COVID-19: Secondary | ICD-10-CM | POA: Diagnosis not present

## 2019-09-02 LAB — HEPARIN LEVEL (UNFRACTIONATED)
Heparin Unfractionated: 0.83 IU/mL — ABNORMAL HIGH (ref 0.30–0.70)
Heparin Unfractionated: 1.04 IU/mL — ABNORMAL HIGH (ref 0.30–0.70)

## 2019-09-02 LAB — CBC
HCT: 32.5 % — ABNORMAL LOW (ref 36.0–46.0)
Hemoglobin: 10.8 g/dL — ABNORMAL LOW (ref 12.0–15.0)
MCH: 31.2 pg (ref 26.0–34.0)
MCHC: 33.2 g/dL (ref 30.0–36.0)
MCV: 93.9 fL (ref 80.0–100.0)
Platelets: 568 10*3/uL — ABNORMAL HIGH (ref 150–400)
RBC: 3.46 MIL/uL — ABNORMAL LOW (ref 3.87–5.11)
RDW: 13.8 % (ref 11.5–15.5)
WBC: 13.2 10*3/uL — ABNORMAL HIGH (ref 4.0–10.5)
nRBC: 0 % (ref 0.0–0.2)

## 2019-09-02 LAB — D-DIMER, QUANTITATIVE: D-Dimer, Quant: 4.75 ug/mL-FEU — ABNORMAL HIGH (ref 0.00–0.50)

## 2019-09-02 LAB — FERRITIN: Ferritin: 532 ng/mL — ABNORMAL HIGH (ref 11–307)

## 2019-09-02 LAB — C-REACTIVE PROTEIN: CRP: 9.5 mg/dL — ABNORMAL HIGH (ref <1.0)

## 2019-09-02 LAB — PROCALCITONIN: Procalcitonin: 0.1 ng/mL

## 2019-09-02 MED ORDER — WHITE PETROLATUM EX OINT
TOPICAL_OINTMENT | CUTANEOUS | Status: AC
  Filled 2019-09-02: qty 28.35

## 2019-09-02 NOTE — Progress Notes (Signed)
ANTICOAGULATION CONSULT NOTE - Follow Up  Pharmacy Consult for Heparin Indication: pulmonary embolus  No Known Allergies  Patient Measurements: Height: 5\' 6"  (167.6 cm) Weight: 184 lb 11.2 oz (83.8 kg) IBW/kg (Calculated) : 59.3 Heparin Dosing Weight: 75 kg  Vital Signs: Temp: 97.6 F (36.4 C) (01/02 0601) Temp Source: Oral (01/02 0601) BP: 123/68 (01/02 0601) Pulse Rate: 70 (01/02 0601)  Labs: Recent Labs    08/30/19 1911 08/30/19 1911 08/30/19 2136 08/31/19 0540 08/31/19 0801 09/01/19 0800 09/01/19 1700 09/02/19 0746 09/02/19 1452  HGB 12.6  --   --  10.9*  --  10.7*  --  10.8*  --   HCT 39.5  --   --  34.4*  --  36.0  --  32.5*  --   PLT 611*  --   --  564*  --  506*  --  568*  --   HEPARINUNFRC  --    < >  --   --   --  0.77* 0.76* 0.83* 1.04*  CREATININE 0.98  --   --  0.82  --   --   --   --   --   TROPONINIHS 29*  --  23* 22* 22*  --   --   --   --    < > = values in this interval not displayed.    Estimated Creatinine Clearance: 71.6 mL/min (by C-G formula based on SCr of 0.82 mg/dL).  Assessment: 69 y.o. female presented on 08/30/2019 with shortness of breath. Pharmacy consulted to dose heparin for PE.CTA found acute PE in right lower and middle lobe with mild right heart strain and RV to LV ratio of 1.16. d-dimer 10.3.  -heparin level= 1.04 on 1500 units/hr   Goal of Therapy:  Heparin level 0.3-0.7 units/ml Monitor platelets by anticoagulation protocol: Yes   Plan:  -Reduce heparin to 1250 units/hr -Heparin level in 6 hours and daily wth CBC daily  Hildred Laser, PharmD Clinical Pharmacist **Pharmacist phone directory can now be found on [email or web address] ([email or web address]).  Listed under [place].

## 2019-09-02 NOTE — Progress Notes (Addendum)
ANTICOAGULATION CONSULT NOTE - Follow Up  Pharmacy Consult for Heparin Indication: pulmonary embolus  No Known Allergies  Patient Measurements: Height: 5\' 6"  (167.6 cm) Weight: 184 lb 11.2 oz (83.8 kg) IBW/kg (Calculated) : 59.3 Heparin Dosing Weight: 75 kg  Vital Signs: Temp: 97.6 F (36.4 C) (01/02 0601) Temp Source: Oral (01/02 0601) BP: 123/68 (01/02 0601) Pulse Rate: 70 (01/02 0601)  Labs: Recent Labs    08/30/19 1911 08/30/19 1911 08/30/19 2136 08/31/19 0540 08/31/19 0801 09/01/19 0800 09/01/19 1700 09/02/19 0746  HGB 12.6  --   --  10.9*  --  10.7*  --  10.8*  HCT 39.5  --   --  34.4*  --  36.0  --  32.5*  PLT 611*  --   --  564*  --  506*  --  568*  HEPARINUNFRC  --    < >  --   --   --  0.77* 0.76* 0.83*  CREATININE 0.98  --   --  0.82  --   --   --   --   TROPONINIHS 29*  --  23* 22* 22*  --   --   --    < > = values in this interval not displayed.    Estimated Creatinine Clearance: 71.6 mL/min (by C-G formula based on SCr of 0.82 mg/dL).  Assessment: 69 y.o. female presented on 08/30/2019 with shortness of breath. Pharmacy consulted to dose heparin for PE.CTA found acute PE in right lower and middle lobe with mild right heart strain and RV to LV ratio of 1.16. d-dimer 10.3. Heparin level of 0.83 is supratherapeutic on heparin 1600 units/hr and this is an increase in the heparin level since rate was reduce. RN not present when phlebotomy drew heparin level and phlebotomy unable to be reached after five calls to confirm how heparin level was drawn. Patient reports that lab drew blood out of left hand and IV is in right arm but then when asked again patient indicated it may have been the same arm just drawn in her hand. Will assume heparin level was drawn correctly. CBC stable. No reported bleeding.   Goal of Therapy:  Heparin level 0.3-0.7 units/ml Monitor platelets by anticoagulation protocol: Yes   Plan:  Reduce heparin to 1500 units/hr  Check heparin  level at 1530  Monitor heparin level, CBC and S/S of bleeding daily  Follow up transition to oral anticoagulant   Cristela Felt, PharmD PGY1 Pharmacy Resident [employer]: [phone]  09/02/2019 8:31 AM

## 2019-09-02 NOTE — Plan of Care (Signed)

## 2019-09-02 NOTE — Progress Notes (Signed)
Hospitalist daily note   Anne [name] [id number] DOB: 08-07-1951 DOA: 08/30/2019  PCP: Greig Right, MD   Narrative:  69 year old female known history of asthma, reflux, HTN, osteoporosis came to ER because of worsening shortness of breath Went to [facility] ED and was diagnosed with coronavirus infection T-max in the emergency room 100.9 requiring 2 L of oxygen  Data Reviewed:  CXR = bilateral infiltrates pleural effusion Ferritin 572-->532 CRP 23--9.5 Procalcitonin 0.18-->0.10 D-dimer 10.3-->4.75 BNP 72.6 WBC 13.2, hb 10.8, plt 568 CT chest positive for acute PE right middle and lower lobe right heart strain is straightening of IV septum small to moderate right and small left pleural effusions Echo 12/31 Ef 60-65% grd 1 DD  Assessment & Plan: Coronavirus 19 infection  imflamm markers are down as is dimer remdisivir through 1/4, steroids through 1/9 Rpt labs when able Pulmonary embolism continue Heparin-monior trends and hemoglobin-transition to NOAC for at least 3 mo Leukocytosis Recheck cxr r/out PNA Check PCT Asthma   Stable, therapetuic heperin, called [facility] [phone] but no answer needs to complete Remdisivir in house    Subjective: Feels much better Asking about going home No chest heaviness no fever no chill no n/v  Consultants:   none Procedures:   n Antimicrobials:   n    Objective: Vitals:   09/01/19 1315 09/01/19 1456 09/01/19 2001 09/02/19 0601  BP: 134/72 139/65 115/62 123/68  Pulse: 74 78 77 70  Resp: (!) 22 19 19 17   Temp:  98.9 F (37.2 C) 97.8 F (36.6 C) 97.6 F (36.4 C)  TempSrc:  Oral Oral Oral  SpO2: 94% 95% 93% 93%  Weight:  85.8 kg  83.8 kg  Height:  5\' 6"  (1.676 m)      Intake/Output Summary (Last 24 hours) at 09/02/2019 0905 Last data filed at 09/02/2019 0540 Gross per 24 hour  Intake 518.24 ml  Output 400 ml  Net 118.24 ml   Filed Weights   08/30/19 1755 09/01/19 1456 09/02/19 0601  Weight: 82.6 kg 85.8 kg 83.8  kg    Examination: eomi ncat no focal deficit cta b no added sound no rales no rhonchi abd soft nt nd no rebound no guard Neuro grossly intact  Scheduled Meds: . dexamethasone (DECADRON) injection  6 mg Intravenous Daily   Continuous Infusions: . heparin 1,600 Units/hr (09/02/19 0540)  . remdesivir 100 mg in NS 100 mL Stopped (09/01/19 1143)     LOS: 2 days   Time spent:  [place], MD [facility]

## 2019-09-03 LAB — CBC
HCT: 36.8 % (ref 36.0–46.0)
Hemoglobin: 12 g/dL (ref 12.0–15.0)
MCH: 30.8 pg (ref 26.0–34.0)
MCHC: 32.6 g/dL (ref 30.0–36.0)
MCV: 94.4 fL (ref 80.0–100.0)
Platelets: 512 10*3/uL — ABNORMAL HIGH (ref 150–400)
RBC: 3.9 MIL/uL (ref 3.87–5.11)
RDW: 13.8 % (ref 11.5–15.5)
WBC: 13.9 10*3/uL — ABNORMAL HIGH (ref 4.0–10.5)
nRBC: 0.2 % (ref 0.0–0.2)

## 2019-09-03 LAB — COMPREHENSIVE METABOLIC PANEL
ALT: 127 U/L — ABNORMAL HIGH (ref 0–44)
AST: 184 U/L — ABNORMAL HIGH (ref 15–41)
Albumin: 2.1 g/dL — ABNORMAL LOW (ref 3.5–5.0)
Alkaline Phosphatase: 70 U/L (ref 38–126)
Anion gap: 10 (ref 5–15)
BUN: 15 mg/dL (ref 8–23)
CO2: 29 mmol/L (ref 22–32)
Calcium: 8.3 mg/dL — ABNORMAL LOW (ref 8.9–10.3)
Chloride: 101 mmol/L (ref 98–111)
Creatinine, Ser: 1.01 mg/dL — ABNORMAL HIGH (ref 0.44–1.00)
GFR calc Af Amer: >60 mL/min (ref >60)
GFR calc non Af Amer: 57 mL/min — ABNORMAL LOW (ref >60)
Glucose, Bld: 98 mg/dL (ref 70–99)
Potassium: 3.1 mmol/L — ABNORMAL LOW (ref 3.5–5.1)
Sodium: 140 mmol/L (ref 135–145)
Total Bilirubin: 0.4 mg/dL (ref 0.3–1.2)
Total Protein: 6.6 g/dL (ref 6.5–8.1)

## 2019-09-03 LAB — C-REACTIVE PROTEIN: CRP: 6.2 mg/dL — ABNORMAL HIGH (ref <1.0)

## 2019-09-03 LAB — FERRITIN: Ferritin: 1316 ng/mL — ABNORMAL HIGH (ref 11–307)

## 2019-09-03 LAB — HEPARIN LEVEL (UNFRACTIONATED): Heparin Unfractionated: 0.66 IU/mL (ref 0.30–0.70)

## 2019-09-03 LAB — PROCALCITONIN: Procalcitonin: <0.1 ng/mL

## 2019-09-03 LAB — D-DIMER, QUANTITATIVE: D-Dimer, Quant: 4.17 ug/mL-FEU — ABNORMAL HIGH (ref 0.00–0.50)

## 2019-09-03 MED ORDER — RIVAROXABAN 20 MG PO TABS: 20.0000 mg | ORAL_TABLET | Freq: Every day | ORAL | Status: DC

## 2019-09-03 MED ORDER — POLYETHYLENE GLYCOL 3350 17 G PO PACK
17.0000 g | PACK | Freq: Every day | ORAL | Status: DC
  Administered 2019-09-03 – 2019-09-05 (×3): 17 g via ORAL
  Filled 2019-09-03 (×3): qty 1

## 2019-09-03 MED ORDER — RIVAROXABAN 15 MG PO TABS
15.0000 mg | ORAL_TABLET | Freq: Two times a day (BID) | ORAL | Status: DC
  Administered 2019-09-03 – 2019-09-05 (×5): 15 mg via ORAL
  Filled 2019-09-03 (×5): qty 1

## 2019-09-03 MED ORDER — POTASSIUM CHLORIDE CRYS ER 20 MEQ PO TBCR
40.0000 meq | EXTENDED_RELEASE_TABLET | Freq: Every day | ORAL | Status: DC
  Administered 2019-09-03 – 2019-09-05 (×3): 40 meq via ORAL
  Filled 2019-09-03 (×3): qty 2

## 2019-09-03 NOTE — Progress Notes (Signed)
ANTICOAGULATION CONSULT NOTE - Follow Up  Pharmacy Consult for Transition from Heparin to Rivaroxaban  Indication: pulmonary embolus  No Known Allergies  Patient Measurements: Height: 5\' 6"  (167.6 cm) Weight: 187 lb 2.7 oz (84.9 kg) IBW/kg (Calculated) : 59.3 Heparin Dosing Weight: 75 kg  Vital Signs: Temp: 98 F (36.7 C) (01/03 0651) Temp Source: Oral (01/03 0651) BP: 137/70 (01/03 0651) Pulse Rate: 77 (01/03 0651)  Labs: Recent Labs    09/01/19 0800 09/02/19 0746 09/02/19 1452 09/02/19 2333  HGB 10.7* 10.8*  --   --   HCT 36.0 32.5*  --   --   PLT 506* 568*  --   --   HEPARINUNFRC 0.77* 0.83* 1.04* 0.66    Estimated Creatinine Clearance: 72 mL/min (by C-G formula based on SCr of 0.82 mg/dL).  Assessment: 69 y.o. female presented on 08/30/2019 with shortness of breath. CTA found acute PE in right lower and middle lobe with mild right heart strain and RV to LV ratio of 1.16. d-dimer 10.3. Patient currently on heparin 1250 units/hr. Pharmacy consulted to transition from heparin to rivaroxaban. Heparin level, CBC and other morning labs have not yet been obtained this morning. No bleeding reported. Of note, case management was consulted to assess cost of apixaban vs. rivaroxaban - will proceed with rivaroxaban at this time per Dr. Verlon Au.   Goal of Therapy:  Heparin level 0.3-0.7 units/ml Monitor platelets by anticoagulation protocol: Yes   Plan:  Discontinue heparin  Start rivaroxban 15mg  twice daily for 21 days followed by 15mg  daily - first dose to be administered at same time heparin infusion stopped (RN aware) Follow up cost assessment of apixaban vs. rivaroxaban by case management  Pharmacy to provide patient education   Cristela Felt, PharmD PGY1 Pharmacy Resident [employer]: [phone]  09/03/2019 8:13 AM

## 2019-09-03 NOTE — Progress Notes (Signed)
SATURATION QUALIFICATIONS: (This note is used to comply with regulatory documentation for home oxygen)  Patient Saturations on Room Air at Rest = 91%  Patient Saturations on Room Air while Ambulating = 95%  Please briefly explain why patient needs home oxygen: Pt does not meet requirements of home oxygen therapy

## 2019-09-03 NOTE — Progress Notes (Signed)
ANTICOAGULATION CONSULT NOTE - Follow Up  Pharmacy Consult for Heparin Indication: pulmonary embolus  No Known Allergies  Patient Measurements: Height: 5\' 6"  (167.6 cm) Weight: 184 lb 11.2 oz (83.8 kg) IBW/kg (Calculated) : 59.3 Heparin Dosing Weight: 75 kg  Vital Signs: Temp: 98.6 F (37 C) (01/02 2012) Temp Source: Oral (01/02 2012) BP: 133/68 (01/02 2012) Pulse Rate: 82 (01/02 2012)  Labs: Recent Labs    08/31/19 0540 08/31/19 0801 09/01/19 0800 09/02/19 0746 09/02/19 1452 09/02/19 2333  HGB 10.9*  --  10.7* 10.8*  --   --   HCT 34.4*  --  36.0 32.5*  --   --   PLT 564*  --  506* 568*  --   --   HEPARINUNFRC  --   --  0.77* 0.83* 1.04* 0.66  CREATININE 0.82  --   --   --   --   --   TROPONINIHS 22* 22*  --   --   --   --     Estimated Creatinine Clearance: 71.6 mL/min (by C-G formula based on SCr of 0.82 mg/dL).  Assessment: 69 y.o. female presented on 08/30/2019 with shortness of breath. Pharmacy consulted to dose heparin for PE.CTA found acute PE in right lower and middle lobe with mild right heart strain and RV to LV ratio of 1.16. d-dimer 10.3. Heparin level of 0.83 is supratherapeutic on heparin 1600 units/hr and this is an increase in the heparin level since rate was reduce. RN not present when phlebotomy drew heparin level and phlebotomy unable to be reached after five calls to confirm how heparin level was drawn. Patient reports that lab drew blood out of left hand and IV is in right arm but then when asked again patient indicated it may have been the same arm just drawn in her hand. Will assume heparin level was drawn correctly. CBC stable. No reported bleeding.   1/3 AM update:  Heparin level therapeutic x 1 after rate decrease  Goal of Therapy:  Heparin level 0.3-0.7 units/ml Monitor platelets by anticoagulation protocol: Yes   Plan:  Cont heparin at 1250 units/hr Confirmatory heparin level with AM labs  Narda Bonds, PharmD, [place]  Pharmacist Phone: [phone]

## 2019-09-03 NOTE — Progress Notes (Addendum)
Hospitalist daily note   Anne [name] [id number] DOB: 09/09/1950 DOA: 08/30/2019  PCP: Greig Right, MD   Narrative:  68 year old female known history of asthma, reflux, HTN, osteoporosis came to ER because of worsening shortness of breath Went to [place] ED and was diagnosed with coronavirus infection T-max in the emergency room 100.9 requiring 2 L of oxygen  Data Reviewed:  CXR = bilateral infiltrates pleural effusion Ferritin 572-->532-->1316 CRP 23--9.5-->6.2 Procalcitonin 0.18-->0.10 D-dimer 10.3-->4.75-->4.17 BNP 72.6 WBC 13.2-->13.9, hb 10.8-->12.0, plt 568 K=3.1 CT chest positive for acute PE right middle and lower lobe right heart strain is straightening of IV septum small to moderate right and small left pleural effusions Echo 12/31 Ef 60-65% grd 1 DD  Assessment & Plan: Coronavirus 19 infection  imflamm markers are down as is dimer remdisivir through 1/4, steroids through 1/9 Overall is stabilizing no new issue-liekly can d/c home am Pulmonary embolism continue Heparin-monitor trends and hemoglobin-transition to NOAC for at least 3 mo Leukocytosis CXr from 1/2 shows possible pna-no labs to guide therapy--overall stable--hold abx for now Awaiting labs Hypokalemia  replace with Kdur today follow am labs  Stable, therapetuic heperin, called son [phone] but no answer needs to complete Remdisivir in house Expect to complete therapies and d/c am 1/4    Subjective: Some n and discomfort--patient states this is stable and a ususal problem in the OP for the past 5 years No other new issues--has been up OOB to RR without any signif SOB  Consultants:   none Procedures:   n Antimicrobials:   n    Objective: Vitals:   09/02/19 1738 09/02/19 2012 09/03/19 0500 09/03/19 0651  BP: (!) 146/79 133/68  137/70  Pulse: 87 82  77  Resp: 18 17    Temp: 97.9 F (36.6 C) 98.6 F (37 C)  98 F (36.7 C)  TempSrc: Oral Oral  Oral  SpO2: 95% 91%  92%   Weight:   84.9 kg   Height:        Intake/Output Summary (Last 24 hours) at 09/03/2019 0753 Last data filed at 09/03/2019 0700 Gross per 24 hour  Intake 390 ml  Output --  Net 390 ml   Filed Weights   09/01/19 1456 09/02/19 0601 09/03/19 0500  Weight: 85.8 kg 83.8 kg 84.9 kg    Examination: Awake coherent in nad no distress not on oxygen eomi ncat cta b no added sound no adventitious sounds abd soft nt nd  Neuro intact no focal deficit  Scheduled Meds: . dexamethasone (DECADRON) injection  6 mg Intravenous Daily  . polyethylene glycol  17 g Oral Daily  . potassium chloride  40 mEq Oral Daily  . rivaroxaban  15 mg Oral BID   Followed by  . [START ON 09/24/2019] rivaroxaban  20 mg Oral Q supper   Continuous Infusions: . remdesivir 100 mg in NS 100 mL 100 mg (09/03/19 1005)     LOS: 3 days   Time spent:  [place], MD [facility]

## 2019-09-03 NOTE — Discharge Instructions (Signed)

## 2019-09-04 ENCOUNTER — Inpatient Hospital Stay (HOSPITAL_COMMUNITY): Payer: Medicare HMO

## 2019-09-04 DIAGNOSIS — J9600 Acute respiratory failure, unspecified whether with hypoxia or hypercapnia: Secondary | ICD-10-CM | POA: Diagnosis not present

## 2019-09-04 DIAGNOSIS — U071 COVID-19: Secondary | ICD-10-CM | POA: Diagnosis not present

## 2019-09-04 DIAGNOSIS — R0602 Shortness of breath: Secondary | ICD-10-CM | POA: Diagnosis not present

## 2019-09-04 LAB — COMPREHENSIVE METABOLIC PANEL
ALT: 159 U/L — ABNORMAL HIGH (ref 0–44)
AST: 167 U/L — ABNORMAL HIGH (ref 15–41)
Albumin: 2.1 g/dL — ABNORMAL LOW (ref 3.5–5.0)
Alkaline Phosphatase: 67 U/L (ref 38–126)
Anion gap: 10 (ref 5–15)
BUN: 14 mg/dL (ref 8–23)
CO2: 27 mmol/L (ref 22–32)
Calcium: 8.1 mg/dL — ABNORMAL LOW (ref 8.9–10.3)
Chloride: 102 mmol/L (ref 98–111)
Creatinine, Ser: 0.86 mg/dL (ref 0.44–1.00)
GFR calc Af Amer: >60 mL/min (ref >60)
GFR calc non Af Amer: >60 mL/min (ref >60)
Glucose, Bld: 98 mg/dL (ref 70–99)
Potassium: 3.7 mmol/L (ref 3.5–5.1)
Sodium: 139 mmol/L (ref 135–145)
Total Bilirubin: 0.3 mg/dL (ref 0.3–1.2)
Total Protein: 6.3 g/dL — ABNORMAL LOW (ref 6.5–8.1)

## 2019-09-04 LAB — CBC
HCT: 33.8 % — ABNORMAL LOW (ref 36.0–46.0)
Hemoglobin: 10.9 g/dL — ABNORMAL LOW (ref 12.0–15.0)
MCH: 30.4 pg (ref 26.0–34.0)
MCHC: 32.2 g/dL (ref 30.0–36.0)
MCV: 94.2 fL (ref 80.0–100.0)
Platelets: 524 10*3/uL — ABNORMAL HIGH (ref 150–400)
RBC: 3.59 MIL/uL — ABNORMAL LOW (ref 3.87–5.11)
RDW: 13.8 % (ref 11.5–15.5)
WBC: 15.6 10*3/uL — ABNORMAL HIGH (ref 4.0–10.5)
nRBC: 0.3 % — ABNORMAL HIGH (ref 0.0–0.2)

## 2019-09-04 LAB — D-DIMER, QUANTITATIVE: D-Dimer, Quant: 3.88 ug/mL-FEU — ABNORMAL HIGH (ref 0.00–0.50)

## 2019-09-04 LAB — CULTURE, BLOOD (ROUTINE X 2)
Culture: NO GROWTH
Culture: NO GROWTH
Special Requests: ADEQUATE
Special Requests: ADEQUATE

## 2019-09-04 LAB — C-REACTIVE PROTEIN: CRP: 4 mg/dL — ABNORMAL HIGH (ref <1.0)

## 2019-09-04 LAB — FERRITIN: Ferritin: 1017 ng/mL — ABNORMAL HIGH (ref 11–307)

## 2019-09-04 MED ORDER — PROMETHAZINE HCL 25 MG PO TABS
25.0000 mg | ORAL_TABLET | Freq: Four times a day (QID) | ORAL | Status: DC | PRN
  Administered 2019-09-04: 10:00:00 25 mg via ORAL
  Filled 2019-09-04: qty 1

## 2019-09-04 MED ORDER — SODIUM CHLORIDE 0.9 % IV SOLN
1.0000 g | INTRAVENOUS | Status: DC
  Administered 2019-09-04 – 2019-09-05 (×2): 1 g via INTRAVENOUS
  Filled 2019-09-04 (×2): qty 10

## 2019-09-04 MED ORDER — PANTOPRAZOLE SODIUM 40 MG PO TBEC
40.0000 mg | DELAYED_RELEASE_TABLET | Freq: Every day | ORAL | Status: DC
  Administered 2019-09-04 – 2019-09-05 (×2): 40 mg via ORAL
  Filled 2019-09-04 (×2): qty 1

## 2019-09-04 MED ORDER — AMLODIPINE BESYLATE 5 MG PO TABS
5.0000 mg | ORAL_TABLET | Freq: Every day | ORAL | Status: DC
  Administered 2019-09-04 – 2019-09-05 (×2): 5 mg via ORAL
  Filled 2019-09-04 (×2): qty 1

## 2019-09-04 NOTE — Progress Notes (Signed)
Patient went to the bathroom and came back and her oxygen saturation went down to 87 on RA. RN told patient to deep cough , she deep cough 3 times and it went up to 90. Will continue to monitor patient.

## 2019-09-04 NOTE — Progress Notes (Signed)
SATURATION QUALIFICATIONS: (This note is used to comply with regulatory documentation for home oxygen)  Patient Saturations on Room Air at Rest = 100%  Patient Saturations on Room Air while Ambulating =85%  Patient Saturations on 4 Liters of oxygen while Ambulating =90-93%  Please briefly explain why patient needs home oxygen: Patient is qualified foe home O2.

## 2019-09-04 NOTE — Evaluation (Addendum)
Physical Therapy Evaluation Patient Details Name: Anne [name] MRN: [id number] DOB: [birth year] Today's Date: 09/04/2019   History of Present Illness  69 year old female known history of asthma, reflux, HTN, osteoporosis came to ER because of worsening [facility]  Clinical Impression  Patient presents with decreased mobility due to decreased strength and activity tolerance.  Currently S for mobility and reports up in the room on her own, but definite limited activity tolerance and educated briefly on use of energy conservation strategies.  Feel she will benefit from skilled PT while in acute setting, but likely not need follow up PT at d/c.      Follow Up Recommendations No PT follow up;Supervision - Intermittent    Equipment Recommendations  Cane;Other (comment)(shower chair)    Recommendations for Other Services       Precautions / Restrictions Precautions Precautions: Fall      Mobility  Bed Mobility Overal bed mobility: Modified Independent                Transfers Overall transfer level: Modified independent                  Ambulation/Gait Ambulation/Gait assistance: Supervision;Min guard Gait Distance (Feet): 200 Feet Assistive device: None Gait Pattern/deviations: Step-through pattern;Decreased stride length;Wide base of support     General Gait Details: stopped x 2 to catch her breath, ambulating on RA, SpO2 90%, but pt dyspneic; asking about how much to pay out of pocked for home O2.  Stable on her feet, but slow and mildly hesitant due to easily fatigued  Stairs            Wheelchair Mobility    Modified Rankin (Stroke Patients Only)       Balance Overall balance assessment: Needs assistance   Sitting balance-Leahy Scale: Good     Standing balance support: No upper extremity supported Standing balance-Leahy Scale: Good Standing balance comment: able  to walk no device and push door open both ways                             Pertinent Vitals/Pain Pain Assessment: No/denies pain    Home Living Family/patient expects to be discharged to:: Private residence Living Arrangements: Other relatives(two grandsons stay with her 10&13, son keeping them now, can help at d/c) Available Help at Discharge: Family;Available PRN/intermittently Type of Home: House Home Access: Stairs to enter Entrance Stairs-Rails: None Entrance Stairs-Number of Steps: 2 Home Layout: One level Home Equipment: Grab bars - tub/shower      Prior Function Level of Independence: Independent         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   Dominant Hand: Right    Extremity/Trunk Assessment   Upper Extremity Assessment Upper Extremity Assessment: Overall WFL for tasks assessed    Lower Extremity Assessment Lower Extremity Assessment: Overall WFL for tasks assessed       Communication   Communication: No difficulties  Cognition Arousal/Alertness: Awake/alert Behavior During Therapy: WFL for tasks assessed/performed Overall Cognitive Status: Within Functional Limits for tasks assessed                                        General Comments General comments (skin integrity, edema, etc.): Encouraged to seek out family assistance  while recouperating due to limited activity tolerance and for pt to utilize energy conservation techniques to allow her to get through her day; agreed to shower seat and cane for home, but felt follow up HHPT not needed right now.    Exercises     Assessment/Plan    PT Assessment Patient needs continued PT services  PT Problem List         PT Treatment Interventions Stair training;Therapeutic activities;Balance training;Therapeutic exercise;Functional mobility training;Gait training;Patient/family education    PT Goals (Current goals can be found in the Care Plan section)   Acute Rehab PT Goals Patient Stated Goal: to get stronger at home PT Goal Formulation: With patient Time For Goal Achievement: 09/18/19 Potential to Achieve Goals: Good    Frequency Min 3X/week   Barriers to discharge        Co-evaluation               AM-PAC PT "6 Clicks" Mobility  Outcome Measure Help needed turning from your back to your side while in a flat bed without using bedrails?: None Help needed moving from lying on your back to sitting on the side of a flat bed without using bedrails?: None Help needed moving to and from a bed to a chair (including a wheelchair)?: None Help needed standing up from a chair using your arms (e.g., wheelchair or bedside chair)?: None Help needed to walk in hospital room?: A Little Help needed climbing 3-5 steps with a railing? : A Little 6 Click Score: 22    End of Session   Activity Tolerance: Patient limited by fatigue Patient left: in bed;with call bell/phone within reach   PT Visit Diagnosis: Muscle weakness (generalized) (M62.81);Difficulty in walking, not elsewhere classified (R26.2)    Time: [id number] PT Time Calculation (min) (ACUTE ONLY): 27 min   Charges:   PT Evaluation $PT Eval Low Complexity: 1 Low $PT Eval Moderate Complexity: 1 Mod PT Treatments $Gait Training: 8-22 mins        Magda Kiel, PT Acute Rehabilitation Services [phone] 09/04/2019   Reginia Naas 09/04/2019, 12:02 PM

## 2019-09-04 NOTE — Progress Notes (Signed)
PROGRESS NOTE    Anne [name]  [id number] DOB: [birth year] DOA: 08/30/2019 PCP: Greig Right, MD    Brief Narrative:  69 y.o. female with no significant past medical history presents to the ER because of worsening shortness of breath.  Patient states she had a right-sided pleuritic type of chest pain which was ongoing for last 1 week and had gone to the ER at [facility] about a week ago and was diagnosed with COVID-19 infection.  Patient at that time was not placed on any medication.  Since patient became more short of breath since then patient decided to come to the ER today.  Patient also has benign subjective feeling of fever chills denies any nausea vomiting or diarrhea.  ED Course: In the ER patient is febrile with temperature 100.9 F requiring 2 L oxygen chest x-ray showing bilateral infiltrates and pleural effusion.  Patient's COVID-19 infection positive results are available in the media section of the chart.  Labs show potassium 3.4 albumin 2.2 troponin high since he was 29 and 23.  CRP 27.9 WBC 18.1 D-dimer was 10.3.  EKG was showing sinus tachycardia.  At the time of this dictation patient CT angiogram was pending.  Patient started on IV Decadron remdesivir admitted for hypoxic respiratory failure secondary to Covid infection  Assessment & Plan:   Principal Problem:   Acute respiratory failure due to COVID-19 [facility])    #1 acute hypoxic respiratory failure secondary to Covid pneumonia and PE-on Decadron and remdesivir. She received a dose of Actemra 12/31 Continue Xarelto Since she became hypoxic to 87% on room air I will watch her overnight and likely DC home if she remains stable. Seen by PT recommends home PT and cane. Leukocytosis worsening 15.6 from 13.9.?  Could be secondary to Decadron  chest x-ray 09/04/2019-today with small bilateral pleural effusion and basilar atelectasis. With worsening hypoxia leukocytosis I have started her on Rocephin for now.   Noted procalcitonin low. Will reevaluate in the morning.  COVID-19 Labs  Recent Labs    09/02/19 0746 09/03/19 0951 09/04/19 0342  DDIMER 4.75* 4.17* 3.88*  FERRITIN 532* 1,316* 1,017*  CRP 9.5* 6.2* 4.0*    No results found for: SARSCOV2NAA  #2 hypertension restart Norvasc  #3 hypokalemia repleted resolved likely secondary to diuretics at home.   Estimated body mass index is 30.31 kg/m as calculated from the following:   Height as of this encounter: 5\' 6"  (1.676 m).   Weight as of this encounter: 85.2 kg.  DVT prophylaxis: Xarelto Code Status: Full code Family Communication: Discussed with patient  disposition Plan: Pending clinical improvement likely home in a.m. if she remains stable with improvement in saturation Consultants:   None  Procedures: None Antimicrobials: Rocephin 09/04/2019  Subjective: Staff reported patient became hypoxic when ambulating to 87% on room air. When I saw her early in the morning she was on room air with normal saturation. She complained of some pleuritic chest pain and shortness of breath. She also had vomiting today with no diarrhea did not respond to Zofran given Phenergan  Objective: Vitals:   09/03/19 2109 09/04/19 0618 09/04/19 0752 09/04/19 1232  BP: 139/78 (!) 141/80 (!) 147/76 (!) 157/82  Pulse: 78 72  76  Resp: (!) 21 16 18 19   Temp: 98.4 F (36.9 C) 98.5 F (36.9 C) 97.7 F (36.5 C) 98 F (36.7 C)  TempSrc: Oral Oral Oral Oral  SpO2: 93% 91%  93%  Weight:  85.2 kg    Height:  Intake/Output Summary (Last 24 hours) at 09/04/2019 1433 Last data filed at 09/04/2019 0858 Gross per 24 hour  Intake 340 ml  Output --  Net 340 ml   Filed Weights   09/02/19 0601 09/03/19 0500 09/04/19 0618  Weight: 83.8 kg 84.9 kg 85.2 kg    Examination:  General exam: Appears calm and comfortable  Respiratory system: Scattered rhonchi with decreased breath sounds at the bases to auscultation. Respiratory effort  normal. Cardiovascular system: S1 & S2 heard, RRR. No JVD, murmurs, rubs, gallops or clicks. No pedal edema. Gastrointestinal system: Abdomen is nondistended, soft and nontender. No organomegaly or masses felt. Normal bowel sounds heard. Central nervous system: Alert and oriented. No focal neurological deficits. Extremities: Symmetric 5 x 5 power. Skin: No rashes, lesions or ulcers Psychiatry: Judgement and insight appear normal. Mood & affect appropriate.     Data Reviewed: I have personally reviewed following labs and imaging studies  CBC: Recent Labs  Lab 08/30/19 1911 08/31/19 0540 09/01/19 0800 09/02/19 0746 09/03/19 0951 09/04/19 0342  WBC 18.1* 12.8* 14.1* 13.2* 13.9* 15.6*  NEUTROABS 14.2* 10.4*  --   --   --   --   HGB 12.6 10.9* 10.7* 10.8* 12.0 10.9*  HCT 39.5 34.4* 36.0 32.5* 36.8 33.8*  MCV 95.0 95.8 101.1* 93.9 94.4 94.2  PLT 611* 564* 506* 568* 512* [id number]*   Basic Metabolic Panel: Recent Labs  Lab 08/30/19 1911 08/31/19 0540 09/03/19 0951 09/04/19 0342  NA 140 138 140 139  K 3.4* 3.1* 3.1* 3.7  CL 98 99 101 102  CO2 22 25 29 27   GLUCOSE 101* 108* 98 98  BUN 11 11 15 14   CREATININE 0.98 0.82 1.01* 0.86  CALCIUM 8.8* 8.1* 8.3* 8.1*   GFR: Estimated Creatinine Clearance: 68.9 mL/min (by C-G formula based on SCr of 0.86 mg/dL). Liver Function Tests: Recent Labs  Lab 08/30/19 1911 08/31/19 0540 09/03/19 0951 09/04/19 0342  AST 28 24 184* 167*  ALT 20 19 127* 159*  ALKPHOS 96 72 70 67  BILITOT 1.0 1.0 0.4 0.3  PROT 11.5* 7.1 6.6 6.3*  ALBUMIN 2.2* 1.9* 2.1* 2.1*   No results for input(s): LIPASE, AMYLASE in the last 168 hours. No results for input(s): AMMONIA in the last 168 hours. Coagulation Profile: No results for input(s): INR, PROTIME in the last 168 hours. Cardiac Enzymes: No results for input(s): CKTOTAL, CKMB, CKMBINDEX, TROPONINI in the last 168 hours. BNP (last 3 results) No results for input(s): PROBNP in the last 8760  hours. HbA1C: No results for input(s): HGBA1C in the last 72 hours. CBG: No results for input(s): GLUCAP in the last 168 hours. Lipid Profile: No results for input(s): CHOL, HDL, LDLCALC, TRIG, CHOLHDL, LDLDIRECT in the last 72 hours. Thyroid Function Tests: No results for input(s): TSH, T4TOTAL, FREET4, T3FREE, THYROIDAB in the last 72 hours. Anemia Panel: Recent Labs    09/03/19 0951 09/04/19 0342  FERRITIN 1,316* 1,017*   Sepsis Labs: Recent Labs  Lab 08/30/19 2014 08/31/19 0540 09/01/19 0947 09/02/19 0746 09/03/19 0951  PROCALCITON  --  0.18  --  0.10 <0.10  LATICACIDVEN 1.6  --  1.9  --   --     Recent Results (from the past 240 hour(s))  Blood Culture (routine x 2)     Status: None   Collection Time: 08/30/19  8:14 PM   Specimen: BLOOD  Result Value Ref Range Status   Specimen Description BLOOD LEFT ARM  Final   Special Requests  Final    BOTTLES DRAWN AEROBIC AND ANAEROBIC Blood Culture adequate volume   Culture   Final    NO GROWTH 5 DAYS Performed at [facility], [place] [address], [place], [place] [address]    Report Status 09/04/2019 FINAL  Final  Blood Culture (routine x 2)     Status: None   Collection Time: 08/30/19  8:24 PM   Specimen: BLOOD  Result Value Ref Range Status   Specimen Description BLOOD RIGHT FOREARM  Final   Special Requests   Final    BOTTLES DRAWN AEROBIC AND ANAEROBIC Blood Culture adequate volume   Culture   Final    NO GROWTH 5 DAYS Performed at [facility], [address], [place], [place] [address]    Report Status 09/04/2019 FINAL  Final         Radiology Studies: DG Chest Port 1 View  Result Date: 09/04/2019 CLINICAL DATA:  Shortness of breath in a patient who is COVID-19 positive. EXAM: PORTABLE CHEST 1 VIEW COMPARISON:  Single-view of the chest 09/02/2019 and 08/30/2019. FINDINGS: Small bilateral pleural effusions are again seen, greater on the right. There is associated basilar atelectasis. Lungs  otherwise clear. No pneumothorax. Heart size is normal. Atherosclerosis noted. IMPRESSION: No notable change in small bilateral pleural effusions and basilar atelectasis. Electronically Signed   By: Inge Rise M.D.   On: 09/04/2019 11:18        Scheduled Meds: . dexamethasone (DECADRON) injection  6 mg Intravenous Daily  . pantoprazole  40 mg Oral Daily  . polyethylene glycol  17 g Oral Daily  . potassium chloride  40 mEq Oral Daily  . rivaroxaban  15 mg Oral BID   Followed by  . [START ON 09/24/2019] rivaroxaban  20 mg Oral Q supper   Continuous Infusions: . cefTRIAXone (ROCEPHIN)  IV 1 g (09/04/19 1211)     LOS: 4 days     Georgette Shell, MD [facility]  If 7PM-7AM, please contact night-coverage [email or web address] Password [facility] 09/04/2019, 2:33 PM

## 2019-09-05 ENCOUNTER — Other Ambulatory Visit: Payer: Self-pay

## 2019-09-05 DIAGNOSIS — U071 COVID-19: Secondary | ICD-10-CM | POA: Diagnosis not present

## 2019-09-05 LAB — CBC
HCT: 33.7 % — ABNORMAL LOW (ref 36.0–46.0)
Hemoglobin: 11 g/dL — ABNORMAL LOW (ref 12.0–15.0)
MCH: 30.6 pg (ref 26.0–34.0)
MCHC: 32.6 g/dL (ref 30.0–36.0)
MCV: 93.9 fL (ref 80.0–100.0)
Platelets: 440 10*3/uL — ABNORMAL HIGH (ref 150–400)
RBC: 3.59 MIL/uL — ABNORMAL LOW (ref 3.87–5.11)
RDW: 14.2 % (ref 11.5–15.5)
WBC: 16.5 10*3/uL — ABNORMAL HIGH (ref 4.0–10.5)
nRBC: 0.1 % (ref 0.0–0.2)

## 2019-09-05 LAB — FERRITIN: Ferritin: 881 ng/mL — ABNORMAL HIGH (ref 11–307)

## 2019-09-05 LAB — C-REACTIVE PROTEIN: CRP: 2.5 mg/dL — ABNORMAL HIGH (ref <1.0)

## 2019-09-05 LAB — D-DIMER, QUANTITATIVE: D-Dimer, Quant: 3.56 ug/mL-FEU — ABNORMAL HIGH (ref 0.00–0.50)

## 2019-09-05 MED ORDER — DEXAMETHASONE 6 MG PO TABS: 6.0000 mg | ORAL_TABLET | Freq: Two times a day (BID) | ORAL | 0 refills | Status: AC

## 2019-09-05 MED ORDER — AMOXICILLIN-POT CLAVULANATE 875-125 MG PO TABS: 1.0000 | ORAL_TABLET | Freq: Two times a day (BID) | ORAL | 0 refills | Status: AC

## 2019-09-05 MED ORDER — RIVAROXABAN (XARELTO) VTE STARTER PACK (15 & 20 MG): ORAL_TABLET | ORAL | 0 refills | Status: AC

## 2019-09-05 MED ORDER — SACCHAROMYCES BOULARDII 250 MG PO CAPS
250.0000 mg | ORAL_CAPSULE | Freq: Two times a day (BID) | ORAL | Status: DC
  Administered 2019-09-05: 12:00:00 250 mg via ORAL
  Filled 2019-09-05: qty 1

## 2019-09-05 MED ORDER — SACCHAROMYCES BOULARDII 250 MG PO CAPS: 250.0000 mg | ORAL_CAPSULE | Freq: Two times a day (BID) | ORAL | Status: AC

## 2019-09-05 MED ORDER — ONDANSETRON HCL 4 MG PO TABS: 4.0000 mg | ORAL_TABLET | Freq: Four times a day (QID) | ORAL | 0 refills | Status: AC | PRN

## 2019-09-05 NOTE — Care Management Important Message (Signed)
Important Message  Patient Details  Name: Anne [name] MRN: [id number] Date of Birth: [birth year]   Medicare Important Message Given:  Yes - Important Message mailed due to current National Emergency  Verbal consent obtained due to current National Emergency  Relationship to patient: Self Contact Name: Harleen Bellavance Call Date: 09/05/19  Time: 66 Phone: [phone] Outcome: No Answer/Busy Important Message mailed to: Patient address on file    Delorse Lek 09/05/2019, 2:23 PM

## 2019-09-05 NOTE — TOC Benefit Eligibility Note (Signed)
Transition of Care [facility]) Benefit Eligibility Note    Patient Details  Name: Anne [name] MRN: [id number] Date of Birth: Jan 12, 1951   Medication/Dose: Alveda Reasons and Eliquis  Covered?: Yes     Prescription Coverage Preferred Pharmacy: [facility] are preferred but patient can use anypharmacy  Spoke with Person/Company/Phone Number:: [employer]  Co-Pay: Both medications are $9.20 for 30 day retail  Prior Approval: No          Delorse Lek Phone Number: 09/05/2019, 9:01 AM

## 2019-09-05 NOTE — Discharge Summary (Signed)
Physician Discharge Summary  Aldene Hirata [id number] DOB: [birth year] DOA: 08/30/2019  PCP: Greig Right, MD  Admit date: 08/30/2019 Discharge date: 09/05/2019  Admitted From: Home Disposition: Home Recommendations for Outpatient Follow-up:  1. Follow up with PCP in 1-2 weeks 2. Please obtain BMP/CBC in one week 3. Please follow up with GI for ongoing nausea and vomiting  Home Health: None Equipment/Devices: Cane and shower chair Discharge Condition stable and improved CODE STATUS: Full code  diet recommendation: Cardiac Brief/Interim Summary:69 y.o.femalewithno significant past medical history presents to the ER because of worsening shortness of breath. Patient states she had a right-sided pleuritic type of chest pain which was ongoing for last 1 week and had gone to the ER at [facility] about a week ago and was diagnosed with COVID-19 infection. Patient at that time was not placed on any medication. Since patient became more short of breath since then patient decided to come to the ER today. Patient also has benign subjective feeling of fever chills denies any nausea vomiting or diarrhea.  ED Course:In the ER patient is febrile with temperature 100.9 F requiring 2 L oxygen chest x-ray showing bilateral infiltrates and pleural effusion. Patient's COVID-19 infection positive results are available in the media section of the chart. Labs show potassium 3.4 albumin 2.2 troponin high since he was 29 and 23. CRP 27.9 WBC 18.1 D-dimer was 10.3. EKG was showing sinus tachycardia. At the time of this dictation patient CT angiogram was pending. Patient started on IV Decadron remdesivir admitted for hypoxic respiratory failure secondary to Covid infection   Discharge Diagnoses:  Principal Problem:   Acute respiratory failure due to COVID-19 [facility])  #1 acute hypoxic respiratory failure secondary to Covid pneumonia and PE-she was treated with Decadron and remdesivir.   She also received a dose of Actemra on 08/31/2019.   She was initially treated with IV heparin and then changed to Xarelto.   She was also started on Rocephin due to hypoxia leukocytosis and worsening bilateral infiltrates. She will be discharged home on Augmentin, Xarelto and Decadron.  She needs to finish a full course of Decadron for 10 days.  She already received 6 days of Decadron in the hospital.  #2 hypertension continue Norvasc, hold HCTZ may consider restarting as an outpatient if needed.  She was hypokalemic from HCTZ please make sure she is on potassium replacement if you restart her on HCTZ.  #3 hypokalemia repleted resolved likely secondary to diuretics at home  #4 persistent chronic nausea and vomiting with elevated LFTs- patient reports she has been having the symptoms for long time at home she has never had any GI work-up.  Advised her to follow-up with GI on discharge.  Estimated body mass index is 30.31 kg/m as calculated from the following:   Height as of this encounter: 5\' 6"  (1.676 m).   Weight as of this encounter: 85.2 kg.  Discharge Instructions  Discharge Instructions    Call MD for:  difficulty breathing, headache or visual disturbances   Complete by: As directed    Call MD for:  persistant nausea and vomiting   Complete by: As directed    Call MD for:  severe uncontrolled pain   Complete by: As directed    Call MD for:  temperature >100.4   Complete by: As directed    Diet - low sodium heart healthy   Complete by: As directed    Increase activity slowly   Complete by: As directed  Allergies as of 09/05/2019   No Known Allergies     Medication List    STOP taking these medications   chlorthalidone 25 MG tablet Commonly known as: HYGROTON   ibuprofen 200 MG tablet Commonly known as: ADVIL     TAKE these medications   acetaminophen 500 MG tablet Commonly known as: TYLENOL Take 500 mg by mouth every 6 (six) hours as needed (pain).    albuterol 108 (90 Base) MCG/ACT inhaler Commonly known as: VENTOLIN HFA Inhale 2 puffs into the lungs every 6 (six) hours as needed for wheezing.   amLODipine 10 MG tablet Commonly known as: NORVASC Take 10 mg by mouth daily.   amoxicillin-clavulanate 875-125 MG tablet Commonly known as: Augmentin Take 1 tablet by mouth 2 (two) times daily for 4 days.   calcium carbonate 500 MG chewable tablet Commonly known as: TUMS - dosed in mg elemental calcium Chew 1 tablet by mouth daily.   dexamethasone 6 MG tablet Commonly known as: DECADRON Take 1 tablet (6 mg total) by mouth 2 (two) times daily.   docusate sodium 100 MG capsule Commonly known as: COLACE Take 200 mg by mouth at bedtime.   famotidine 20 MG tablet Commonly known as: PEPCID Take 20 mg by mouth daily.   HYDROcodone-acetaminophen 5-325 MG tablet Commonly known as: NORCO/VICODIN Take 1 tablet by mouth every 6 (six) hours as needed for pain.   hydroxyurea 500 MG capsule Commonly known as: HYDREA Take 500-1,000 mg by mouth See admin instructions. Pt alternates 50 mg every other day, with 100 mg   lansoprazole 30 MG capsule Commonly known as: PREVACID Take 30 mg by mouth daily.   multivitamin with minerals Tabs tablet Take 1 tablet by mouth daily.   ondansetron 4 MG tablet Commonly known as: ZOFRAN Take 1 tablet (4 mg total) by mouth every 6 (six) hours as needed for nausea.   Rivaroxaban 15 & 20 MG Tbpk Follow package directions: Take one 15mg  tablet by mouth twice a day. On day 22, switch to one 20mg  tablet once a day. Take with food.   saccharomyces boulardii 250 MG capsule Commonly known as: FLORASTOR Take 1 capsule (250 mg total) by mouth 2 (two) times daily.            Durable Medical Equipment  (From admission, onward)         Start     Ordered   09/05/19 1032  DME Cane  Once     09/05/19 1031   09/05/19 1032  DME Shower stool  Once     09/05/19 1031         Follow-up Information     Greig Right, MD Follow up.   Specialty: Family Medicine Contact information: [place] [address] [phone]        Juanita Craver, MD Follow up.   Specialty: Gastroenterology Contact information: [address], [place] [address] [phone]          No Known Allergies  Consultations: None  Procedures/Studies: CT Angio Chest PE W and/or Wo Contrast  Result Date: 08/31/2019 CLINICAL DATA:  Shortness of breath. Chest pain. Hemoptysis. EXAM: CT ANGIOGRAPHY CHEST WITH CONTRAST TECHNIQUE: Multidetector CT imaging of the chest was performed using the standard protocol during bolus administration of intravenous contrast. Multiplanar CT image reconstructions and MIPs were obtained to evaluate the vascular anatomy. CONTRAST:  30mL OMNIPAQUE IOHEXOL 350 MG/ML SOLN COMPARISON:  Radiograph yesterday. FINDINGS: Cardiovascular: Positive for acute  PE with filling defects in the right lower lobe segmental and subsegmental branches. Additional filling defects within the central right middle lobar branches. No definite left-sided pulmonary emboli. Mild straightening of the intraventricular septum with RV to LV ratio of 1.16. Mild aortic atherosclerosis without aneurysm. Heart is normal in size. No pericardial effusion. Mediastinum/Nodes: Small mediastinal and hilar nodes, all subcentimeter. No thyroid nodule. Small to moderate hiatal hernia with minimal retained contents in the distal esophagus Lungs/Pleura: Small to moderate right pleural effusion. Patchy and consolidative opacities throughout the right lower lobe. Small left pleural effusion. Adjacent compressive atelectasis cells linear atelectasis in the lingula and left lower lobe. Breathing motion artifact partially obscures evaluation of the lung bases. No pulmonary edema. The trachea and mainstem bronchi are patent. Upper Abdomen: No acute findings. Musculoskeletal: Exaggerated thoracic  kyphosis. Degenerative change in the spine. There are no acute or suspicious osseous abnormalities. Review of the MIP images confirms the above findings. IMPRESSION: 1. Positive for acute PE in the right lower and middle lobe. Mild right heart strain with straightening of the intraventricular septum and RV to LV ratio of 1.16. 2. Small to moderate right and small left pleural effusions. Patchy and consolidative opacities in the right lower lobe may be atelectasis or pneumonia. Distribution not classic for pulmonary infarct. Breathing motion artifact through the bases partially obscures evaluation. Aortic Atherosclerosis (ICD10-I70.0). Critical Value/emergent results were called by telephone at the time of interpretation on 08/31/2019 at 1:53 am to Dr Leeanne Mannan , who verbally acknowledged these results. Electronically Signed   By: Keith Rake M.D.   On: 08/31/2019 01:54   DG Chest Port 1 View  Result Date: 09/04/2019 CLINICAL DATA:  Shortness of breath in a patient who is COVID-19 positive. EXAM: PORTABLE CHEST 1 VIEW COMPARISON:  Single-view of the chest 09/02/2019 and 08/30/2019. FINDINGS: Small bilateral pleural effusions are again seen, greater on the right. There is associated basilar atelectasis. Lungs otherwise clear. No pneumothorax. Heart size is normal. Atherosclerosis noted. IMPRESSION: No notable change in small bilateral pleural effusions and basilar atelectasis. Electronically Signed   By: Inge Rise M.D.   On: 09/04/2019 11:18   DG CHEST PORT 1 VIEW  Result Date: 09/02/2019 CLINICAL DATA:  Pneumonia. COVID-19 positive. EXAM: PORTABLE CHEST 1 VIEW COMPARISON:  August 30, 2019. FINDINGS: Stable cardiomediastinal silhouette. No pneumothorax is noted. Hypoinflation of the lungs is noted with bibasilar atelectasis or infiltrates and associated pleural effusions. Bony thorax is unremarkable. IMPRESSION: Hypoinflation of the lungs is noted with bibasilar atelectasis or infiltrates and  associated pleural effusions. No pneumothorax is noted. Electronically Signed   By: Marijo Conception M.D.   On: 09/02/2019 09:58   DG Chest Port 1 View  Result Date: 08/30/2019 CLINICAL DATA:  69 year old female with history of ongoing shortness of breath and chest pain. EXAM: PORTABLE CHEST 1 VIEW COMPARISON:  Chest x-ray 09/13/2015. FINDINGS: Lung volumes are low with bibasilar opacities which may reflect areas of atelectasis and/or consolidation (right greater than left). Moderate right and small left pleural effusions. No evidence of pulmonary edema. Heart size is normal. Upper mediastinal contours are within normal limits. No pneumothorax. IMPRESSION: 1. Low lung volumes with areas of atelectasis and/or consolidation in the in the lung bases bilaterally, with moderate right and small left pleural effusions. Electronically Signed   By: Vinnie Langton M.D.   On: 08/30/2019 20:42   ECHOCARDIOGRAM COMPLETE  Result Date: 08/31/2019   ECHOCARDIOGRAM REPORT   Patient Name:   DEYA BEADLE Date  of Exam: 08/31/2019 Medical Rec #:  [id number]              Height:       66.0 in Accession #:    [id number]             Weight:       182.0 lb Date of Birth:  1951/04/01               BSA:          1.92 m Patient Age:    40 years               BP:           129/70 mmHg Patient Gender: F                      HR:           69 bpm. Exam Location:  Inpatient Procedure: 2D Echo Indications:    Atrial Fibrillation 427.31 / I48.91  History:        Patient has no prior history of Echocardiogram examinations.                 Acute respiratory failure due to COVID-19.  Sonographer:    Vikki Ports Turrentine Referring Phys: 15 Rise Patience  Sonographer Comments: Acute respiratory failure due to COVID-19 IMPRESSIONS  1. Left ventricular ejection fraction, by visual estimation, is 60 to 65%. The left ventricle has normal function. There is moderately increased left ventricular hypertrophy.  2. Left ventricular  diastolic parameters are consistent with Grade I diastolic dysfunction (impaired relaxation).  3. The left ventricle has no regional wall motion abnormalities.  4. Global right ventricle has normal systolic function.The right ventricular size is normal. No increase in right ventricular wall thickness.  5. Left atrial size was moderately dilated.  6. Right atrial size was normal.  7. The mitral valve is abnormal. Mild mitral valve regurgitation.  8. The tricuspid valve is grossly normal.  9. The aortic valve is tricuspid. Aortic valve regurgitation is not visualized. 10. The pulmonic valve was grossly normal. Pulmonic valve regurgitation is not visualized. 11. Normal pulmonary artery systolic pressure. FINDINGS  Left Ventricle: Left ventricular ejection fraction, by visual estimation, is 60 to 65%. The left ventricle has normal function. The left ventricle has no regional wall motion abnormalities. There is moderately increased left ventricular hypertrophy. Left ventricular diastolic parameters are consistent with Grade I diastolic dysfunction (impaired relaxation). Indeterminate filling pressures. Right Ventricle: The right ventricular size is normal. No increase in right ventricular wall thickness. Global RV systolic function is has normal systolic function. The tricuspid regurgitant velocity is 2.37 m/s, and with an assumed right atrial pressure  of 3 mmHg, the estimated right ventricular systolic pressure is normal at 25.4 mmHg. Left Atrium: Left atrial size was moderately dilated. Right Atrium: Right atrial size was normal in size Pericardium: There is no evidence of pericardial effusion. Mitral Valve: The mitral valve is abnormal. There is mild thickening of the mitral valve leaflet(s). Mild mitral valve regurgitation. Tricuspid Valve: The tricuspid valve is grossly normal. Tricuspid valve regurgitation is mild. Aortic Valve: The aortic valve is tricuspid. Aortic valve regurgitation is not visualized. Pulmonic  Valve: The pulmonic valve was grossly normal. Pulmonic valve regurgitation is not visualized. Pulmonic regurgitation is not visualized. Aorta: The aortic root and ascending aorta are structurally normal, with no evidence of dilitation. IAS/Shunts: No atrial level shunt detected by color flow Doppler.  LEFT VENTRICLE PLAX 2D LVIDd:         4.10 cm  Diastology LVIDs:         2.60 cm  LV e' lateral:   9.36 cm/s LV PW:         1.20 cm  LV E/e' lateral: 5.9 LV IVS:        1.20 cm  LV e' medial:    7.83 cm/s LVOT diam:     2.00 cm  LV E/e' medial:  7.0 LV SV:         50 ml LV SV Index:   25.01 LVOT Area:     3.14 cm  RIGHT VENTRICLE RV S prime:     14.30 cm/s TAPSE (M-mode): 1.9 cm LEFT ATRIUM             Index       RIGHT ATRIUM           Index LA diam:        4.40 cm 2.29 cm/m  RA Area:     18.50 cm LA Vol (A2C):   35.7 ml 18.58 ml/m RA Volume:   51.10 ml  26.60 ml/m LA Vol (A4C):   77.2 ml 40.18 ml/m LA Biplane Vol: 55.1 ml 28.68 ml/m  AORTIC VALVE LVOT Vmax:   112.00 cm/s LVOT Vmean:  80.300 cm/s LVOT VTI:    0.222 m  AORTA Ao Root diam: 2.70 cm MITRAL VALVE                        TRICUSPID VALVE MV Area (PHT): 4.21 cm             TR Peak grad:   22.4 mmHg MV PHT:        52.20 msec           TR Vmax:        241.00 cm/s MV Decel Time: 180 msec MV E velocity: 54.80 cm/s 103 cm/s  SHUNTS MV A velocity: 63.00 cm/s 70.3 cm/s Systemic VTI:  0.22 m MV E/A ratio:  0.87       1.5       Systemic Diam: 2.00 cm  Lyman Bishop MD Electronically signed by Lyman Bishop MD Signature Date/Time: 08/31/2019/12:24:48 PM    Final     (Echo, Carotid, EGD, Colonoscopy, ERCP)    Subjective: She is resting in bed on room air feels comfortable and well enough to go home anxious to go home  Discharge Exam: Vitals:   09/05/19 0900 09/05/19 0929  BP:    Pulse: (!) 112 99  Resp:    Temp:    SpO2: 96%    Vitals:   09/04/19 1634 09/05/19 0555 09/05/19 0900 09/05/19 0929  BP: 133/77 (!) 158/77    Pulse: 74 70 (!) 112 99   Resp: 19 16    Temp: 98.2 F (36.8 C) 99.1 F (37.3 C)    TempSrc: Oral Oral    SpO2: 94% 94% 96%   Weight:      Height:        General: Pt is alert, awake, not in acute distress Cardiovascular: RRR, S1/S2 +, no rubs, no gallops Respiratory: Scattered rhonchi bilaterally, no wheezing, no rhonchi Abdominal: Soft, NT, ND, bowel sounds + Extremities: no edema, no cyanosis    The results of significant diagnostics from this hospitalization (including imaging, microbiology, ancillary and laboratory) are listed below for reference.     Microbiology: Recent Results (from  the past 240 hour(s))  Blood Culture (routine x 2)     Status: None   Collection Time: 08/30/19  8:14 PM   Specimen: BLOOD  Result Value Ref Range Status   Specimen Description BLOOD LEFT ARM  Final   Special Requests   Final    BOTTLES DRAWN AEROBIC AND ANAEROBIC Blood Culture adequate volume   Culture   Final    NO GROWTH 5 DAYS Performed at [facility], [address] [place], [place] [address]    Report Status 09/04/2019 FINAL  Final  Blood Culture (routine x 2)     Status: None   Collection Time: 08/30/19  8:24 PM   Specimen: BLOOD  Result Value Ref Range Status   Specimen Description BLOOD RIGHT FOREARM  Final   Special Requests   Final    BOTTLES DRAWN AEROBIC AND ANAEROBIC Blood Culture adequate volume   Culture   Final    NO GROWTH 5 DAYS Performed at [facility], [address], [place], [place] [address]    Report Status 09/04/2019 FINAL  Final     Labs: BNP (last 3 results) Recent Labs    08/30/19 1937  BNP [id number]   Basic Metabolic Panel: Recent Labs  Lab 08/30/19 1911 08/31/19 0540 09/03/19 0951 09/04/19 0342  NA 140 138 140 139  K 3.4* 3.1* 3.1* 3.7  CL 98 99 101 102  CO2 22 25 29 27   GLUCOSE 101* 108* 98 98  BUN 11 11 15 14   CREATININE 0.98 0.82 1.01* 0.86  CALCIUM 8.8* 8.1* 8.3* 8.1*   Liver Function Tests: Recent Labs  Lab 08/30/19 1911 08/31/19 0540  09/03/19 0951 09/04/19 0342  AST 28 24 184* 167*  ALT 20 19 127* 159*  ALKPHOS 96 72 70 67  BILITOT 1.0 1.0 0.4 0.3  PROT 11.5* 7.1 6.6 6.3*  ALBUMIN 2.2* 1.9* 2.1* 2.1*   No results for input(s): LIPASE, AMYLASE in the last 168 hours. No results for input(s): AMMONIA in the last 168 hours. CBC: Recent Labs  Lab 08/30/19 1911 08/31/19 0540 09/01/19 0800 09/02/19 0746 09/03/19 0951 09/04/19 0342 09/05/19 0435  WBC 18.1* 12.8* 14.1* 13.2* 13.9* 15.6* 16.5*  NEUTROABS 14.2* 10.4*  --   --   --   --   --   HGB 12.6 10.9* 10.7* 10.8* 12.0 10.9* 11.0*  HCT 39.5 34.4* 36.0 32.5* 36.8 33.8* 33.7*  MCV 95.0 95.8 101.1* 93.9 94.4 94.2 93.9  PLT 611* 564* 506* 568* 512* 524* 440*   Cardiac Enzymes: No results for input(s): CKTOTAL, CKMB, CKMBINDEX, TROPONINI in the last 168 hours. BNP: Invalid input(s): POCBNP CBG: No results for input(s): GLUCAP in the last 168 hours. D-Dimer Recent Labs    09/04/19 0342 09/05/19 0435  DDIMER 3.88* 3.56*   Hgb A1c No results for input(s): HGBA1C in the last 72 hours. Lipid Profile No results for input(s): CHOL, HDL, LDLCALC, TRIG, CHOLHDL, LDLDIRECT in the last 72 hours. Thyroid function studies No results for input(s): TSH, T4TOTAL, T3FREE, THYROIDAB in the last 72 hours.  Invalid input(s): FREET3 Anemia work up Recent Labs    09/04/19 0342 09/05/19 0435  FERRITIN 1,017* 881*   Urinalysis No results found for: COLORURINE, APPEARANCEUR, LABSPEC, [place], GLUCOSEU, [place], [place], [place], PROTEINUR, UROBILINOGEN, NITRITE, LEUKOCYTESUR Sepsis Labs Invalid input(s): PROCALCITONIN,  WBC,  LACTICIDVEN Microbiology Recent Results (from the past 240 hour(s))  Blood Culture (routine x 2)     Status: None   Collection Time: 08/30/19  8:14 PM   Specimen: BLOOD  Result Value Ref Range Status   Specimen Description BLOOD LEFT ARM  Final   Special Requests   Final    BOTTLES DRAWN AEROBIC AND ANAEROBIC Blood Culture adequate  volume   Culture   Final    NO GROWTH 5 DAYS Performed at [facility], [address], [place], [place] [address]    Report Status 09/04/2019 FINAL  Final  Blood Culture (routine x 2)     Status: None   Collection Time: 08/30/19  8:24 PM   Specimen: BLOOD  Result Value Ref Range Status   Specimen Description BLOOD RIGHT FOREARM  Final   Special Requests   Final    BOTTLES DRAWN AEROBIC AND ANAEROBIC Blood Culture adequate volume   Culture   Final    NO GROWTH 5 DAYS Performed at [facility], [address] [place],  [address]    Report Status 09/04/2019 FINAL  Final     Time coordinating discharge:  39 minutes  SIGNED:   Georgette Shell, MD  [facility] 09/05/2019, 10:32 AM   If 7PM-7AM, please contact night-coverage [email or web address] Password [email or web address]

## 2019-09-05 NOTE — Progress Notes (Signed)
Physical Therapy Treatment Patient Details Name: Anne [name] MRN: [id number] DOB: 1950/10/19 Today's Date: 09/05/2019    History of Present Illness 69 year old female known history of asthma, reflux, HTN, osteoporosis came to ER because of worsening shortness of breathWent to [facility] and was diagnosed with coronavirus infection    PT Comments    Pt is getting ready to discharge and is agreeable to stair training and HEP education prior to going home.  Pt is mod I for transfers and ambulation without AD. Pt educated on having someone assist her up the stairs as they do not have rails. Pt able to ascend/descend 1 step 4 times with min A. Pt able to perform all exercises in HEP (see below) and educated on energy conservation. D/c plans remain appropriate.  Access Code: [email or web address]  URL: [email or web address]  Date: 09/05/2019  Prepared by: Leia Alf Fleet     Follow Up Recommendations  No PT follow up;Supervision - Intermittent     Equipment Recommendations  Cane;Other (comment)    Recommendations for Other Services       Precautions / Restrictions Precautions Precautions: Fall Restrictions Weight Bearing Restrictions: No    Mobility  Bed Mobility               General bed mobility comments: up in chair on entry   Transfers Overall transfer level: Modified independent                  Ambulation/Gait Ambulation/Gait assistance: Modified independent (Device/Increase time) Gait Distance (Feet): 18 Feet Assistive device: None Gait Pattern/deviations: Step-through pattern;Decreased stride length;Wide base of support Gait velocity: slowed Gait velocity interpretation: <1.31 ft/sec, indicative of household ambulator General Gait Details: slow, steady gait, with 3/4 DoE, SaO2 on RA 93%O2   Stairs Stairs: Yes Stairs assistance: Min assist Stair Management: No rails           Balance Overall balance assessment: Needs  assistance   Sitting balance-Leahy Scale: Good     Standing balance support: No upper extremity supported Standing balance-Leahy Scale: Good Standing balance comment: able to walk no device and push door open both ways                            Cognition Arousal/Alertness: Awake/alert Behavior During Therapy: WFL for tasks assessed/performed Overall Cognitive Status: Within Functional Limits for tasks assessed                                        Exercises General Exercises - Upper Extremity Shoulder Flexion: AROM;Both;5 reps;Seated Elbow Flexion: AROM;Both;5 reps;Seated General Exercises - Lower Extremity Long Arc Quad: AROM;Both;5 reps;Seated Hip Flexion/Marching: AROM;Both;5 reps;Seated Toe Raises: AROM;Both;5 reps;Seated Heel Raises: AROM;Both;5 reps;Seated    General Comments General comments (skin integrity, edema, etc.): Educated on HEP, and energy conservation techniques to allow for increased mobility throughout day      Pertinent Vitals/Pain Pain Assessment: No/denies pain           PT Goals (current goals can now be found in the care plan section) Acute Rehab PT Goals PT Goal Formulation: With patient Time For Goal Achievement: 09/18/19 Potential to Achieve Goals: Good Progress towards PT goals: Progressing toward goals    Frequency    Min 3X/week      PT Plan Current plan remains appropriate       AM-PAC  PT "6 Clicks" Mobility   Outcome Measure  Help needed turning from your back to your side while in a flat bed without using bedrails?: None Help needed moving from lying on your back to sitting on the side of a flat bed without using bedrails?: None Help needed moving to and from a bed to a chair (including a wheelchair)?: None Help needed standing up from a chair using your arms (e.g., wheelchair or bedside chair)?: None Help needed to walk in hospital room?: None Help needed climbing 3-5 steps with a railing?  : A Little 6 Click Score: 23    End of Session   Activity Tolerance: Patient limited by fatigue Patient left: in chair;with call bell/phone within reach Nurse Communication: Mobility status PT Visit Diagnosis: Muscle weakness (generalized) (M62.81);Difficulty in walking, not elsewhere classified (R26.2)     Time: [id number] PT Time Calculation (min) (ACUTE ONLY): 24 min  Charges:  $Gait Training: 8-22 mins $Therapeutic Exercise: 8-22 mins                     Jyoti Harju B. Migdalia Dk PT, DPT Acute Rehabilitation Services Pager [phone] Office [phone]    [place] 09/05/2019, 1:56 PM

## 2019-09-05 NOTE — Progress Notes (Signed)
Nsg Discharge Note  Admit Date:  08/30/2019 Discharge date: 09/05/2019   Anne [name] to be D/C'd home per MD order.  AVS completed. Patient/caregiver able to verbalize understanding.  Discharge Medication: Allergies as of 09/05/2019   No Known Allergies     Medication List    STOP taking these medications   acetaminophen 500 MG tablet Commonly known as: TYLENOL   chlorthalidone 25 MG tablet Commonly known as: HYGROTON   ibuprofen 200 MG tablet Commonly known as: ADVIL     TAKE these medications   albuterol 108 (90 Base) MCG/ACT inhaler Commonly known as: VENTOLIN HFA Inhale 2 puffs into the lungs every 6 (six) hours as needed for wheezing.   amLODipine 10 MG tablet Commonly known as: NORVASC Take 10 mg by mouth daily.   amoxicillin-clavulanate 875-125 MG tablet Commonly known as: Augmentin Take 1 tablet by mouth 2 (two) times daily for 4 days.   calcium carbonate 500 MG chewable tablet Commonly known as: TUMS - dosed in mg elemental calcium Chew 1 tablet by mouth daily.   dexamethasone 6 MG tablet Commonly known as: DECADRON Take 1 tablet (6 mg total) by mouth 2 (two) times daily.   docusate sodium 100 MG capsule Commonly known as: COLACE Take 200 mg by mouth at bedtime.   famotidine 20 MG tablet Commonly known as: PEPCID Take 20 mg by mouth daily.   HYDROcodone-acetaminophen 5-325 MG tablet Commonly known as: NORCO/VICODIN Take 1 tablet by mouth every 6 (six) hours as needed for pain.   hydroxyurea 500 MG capsule Commonly known as: HYDREA Take 500-1,000 mg by mouth See admin instructions. Pt alternates 50 mg every other day, with 100 mg   lansoprazole 30 MG capsule Commonly known as: PREVACID Take 30 mg by mouth daily.   multivitamin with minerals Tabs tablet Take 1 tablet by mouth daily.   ondansetron 4 MG tablet Commonly known as: ZOFRAN Take 1 tablet (4 mg total) by mouth every 6 (six) hours as needed for nausea.   Rivaroxaban 15 &  20 MG Tbpk Follow package directions: Take one 15mg  tablet by mouth twice a day. On day 22, switch to one 20mg  tablet once a day. Take with food.   saccharomyces boulardii 250 MG capsule Commonly known as: FLORASTOR Take 1 capsule (250 mg total) by mouth 2 (two) times daily.            Durable Medical Equipment  (From admission, onward)         Start     Ordered   09/05/19 1032  DME Cane  Once     09/05/19 1031   09/05/19 1032  DME Shower stool  Once     09/05/19 1031          Discharge Assessment: Vitals:   09/05/19 0929 09/05/19 1300  BP:  132/86  Pulse: 99 94  Resp:    Temp:  98.6 F (37 C)  SpO2:  97%   Skin clean, dry and intact without evidence of skin break down, no evidence of skin tears noted. IV catheter discontinued intact. Site without signs and symptoms of complications - no redness or edema noted at insertion site, patient denies c/o pain - only slight tenderness at site.  Dressing with slight pressure applied.  D/c Instructions-Education: Discharge instructions given to patient/family with verbalized understanding. D/c education completed with patient/family including follow up instructions, medication list, d/c activities limitations if indicated, with other d/c instructions as indicated by MD - patient able to  verbalize understanding, all questions fully answered. Patient instructed to return to ED, call 911, or call MD for any changes in condition.  Patient escorted via [place], and D/C home via private auto.  Atilano Ina, RN 09/05/2019 2:37 PM

## 2019-09-05 NOTE — TOC Transition Note (Signed)
Transition of Care [facility]) - CM/SW Discharge Note   Patient Details  Name: Anne [name] MRN: [id number] Date of Birth: 1951/06/18  Transition of Care [facility]) CM/SW Contact:  Maryclare Labrador, RN Phone Number: 09/05/2019, 11:03 AM   Clinical Narrative:  PTA independent from home- pts family will transport her home at discharge.  Pt confirms she has a PCP and denied barriers with paying for prescriptions.  Pt will discharge home on Xarelto - benefit check submitted and copay shared with pt.  CM called pts preferred pharmacy and provided free 30 day benefit information verbally to pharmacist - benefit can not be applied per pharmacist.  CM spoke with pt and pt denied barrier with paying the copay.  No other orders written, discharge order signed.  CM signing off    Final next level of care: Home/Self Care Barriers to Discharge: Barriers Resolved   Patient Goals and CMS Choice Patient states their goals for this hospitalization and ongoing recovery are:: Pt states she is ready to get home and finally enjoy the new year CMS Medicare.gov Compare Post Acute Care list provided to:: Patient Choice offered to / list presented to : Patient  Discharge Placement                       Discharge Plan and Services                DME Arranged: Kasandra Knudsen DME Agency: [employer] Date DME Agency Contacted: 09/05/19 Time DME Agency Contacted: [phone] Representative spoke with at DME Agency: [place] ([place]) Interventions     Readmission Risk Interventions No flowsheet data found.

## 2019-09-06 DIAGNOSIS — G4489 Other headache syndrome: Secondary | ICD-10-CM | POA: Diagnosis not present

## 2019-09-06 DIAGNOSIS — B342 Coronavirus infection, unspecified: Secondary | ICD-10-CM | POA: Diagnosis not present

## 2019-09-21 DIAGNOSIS — J9811 Atelectasis: Secondary | ICD-10-CM | POA: Diagnosis not present

## 2019-09-21 DIAGNOSIS — U071 COVID-19: Secondary | ICD-10-CM | POA: Diagnosis not present

## 2019-09-21 DIAGNOSIS — E876 Hypokalemia: Secondary | ICD-10-CM | POA: Diagnosis not present

## 2019-09-21 DIAGNOSIS — J189 Pneumonia, unspecified organism: Secondary | ICD-10-CM | POA: Diagnosis not present

## 2019-09-21 DIAGNOSIS — J1281 Pneumonia due to SARS-associated coronavirus: Secondary | ICD-10-CM | POA: Diagnosis not present

## 2019-09-21 DIAGNOSIS — I2699 Other pulmonary embolism without acute cor pulmonale: Secondary | ICD-10-CM | POA: Diagnosis not present

## 2019-10-18 DIAGNOSIS — D473 Essential (hemorrhagic) thrombocythemia: Secondary | ICD-10-CM | POA: Diagnosis not present

## 2019-10-18 DIAGNOSIS — Z79899 Other long term (current) drug therapy: Secondary | ICD-10-CM | POA: Diagnosis not present

## 2019-11-01 DIAGNOSIS — E7800 Pure hypercholesterolemia, unspecified: Secondary | ICD-10-CM | POA: Diagnosis not present

## 2019-11-01 DIAGNOSIS — I10 Essential (primary) hypertension: Secondary | ICD-10-CM | POA: Diagnosis not present

## 2019-11-01 DIAGNOSIS — Z6833 Body mass index (BMI) 33.0-33.9, adult: Secondary | ICD-10-CM | POA: Diagnosis not present

## 2019-11-01 DIAGNOSIS — K219 Gastro-esophageal reflux disease without esophagitis: Secondary | ICD-10-CM | POA: Diagnosis not present

## 2019-11-01 DIAGNOSIS — E669 Obesity, unspecified: Secondary | ICD-10-CM | POA: Diagnosis not present

## 2019-11-06 DIAGNOSIS — L0889 Other specified local infections of the skin and subcutaneous tissue: Secondary | ICD-10-CM | POA: Diagnosis not present

## 2020-01-11 IMAGING — CT CT ANGIO CHEST
2 of 6 series · 17 of 46 positions shown · IV contrast (omnipaque)
Comparison: Radiograph yesterday.

CLINICAL DATA: Shortness of breath. Chest pain. Hemoptysis.

EXAM:
CT ANGIOGRAPHY CHEST WITH CONTRAST
TECHNIQUE: Multidetector CT imaging of the chest was performed using the
standard protocol during bolus administration of intravenous
contrast. Multiplanar CT image reconstructions and MIPs were
obtained to evaluate the vascular anatomy.
CONTRAST:  75mL OMNIPAQUE IOHEXOL 350 MG/ML SOLN

[Series 6: thins · axial · 0.77mm/px · z∈[+1302,+1512]mm · 14 of 230 slices shown]
[im 10/230  lung]
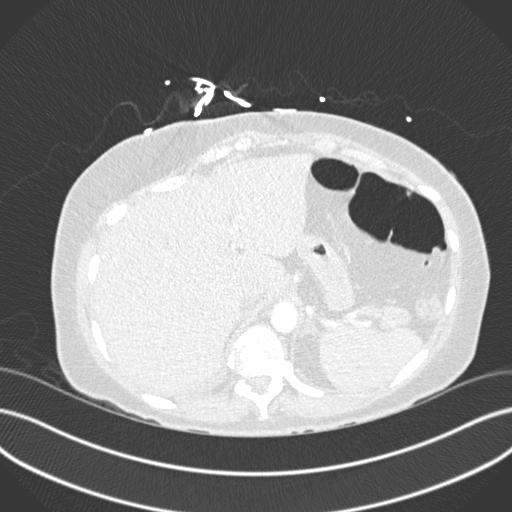
[im 30/230  soft-tissue]
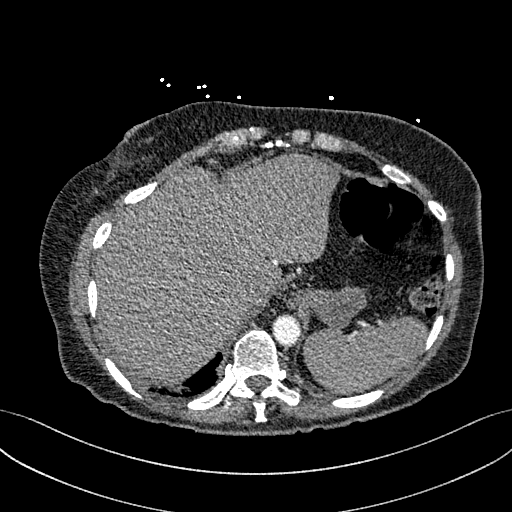
[im 40/230  lung]
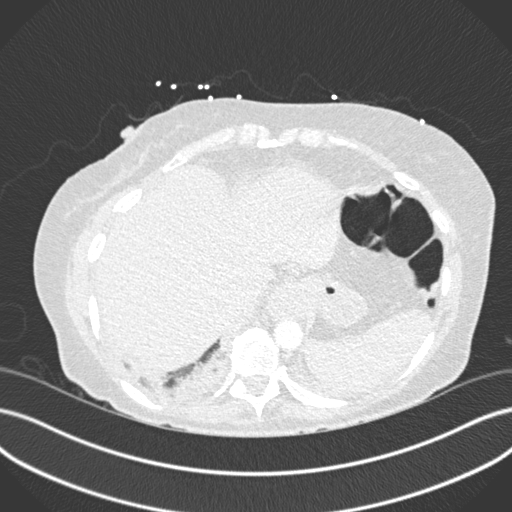
[im 60/230  soft-tissue]
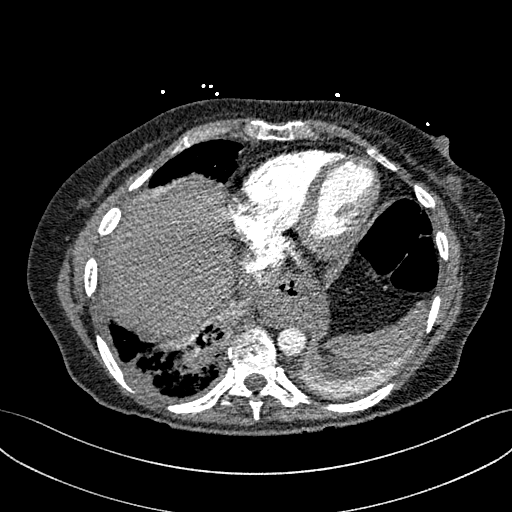
[im 80/230  lung]
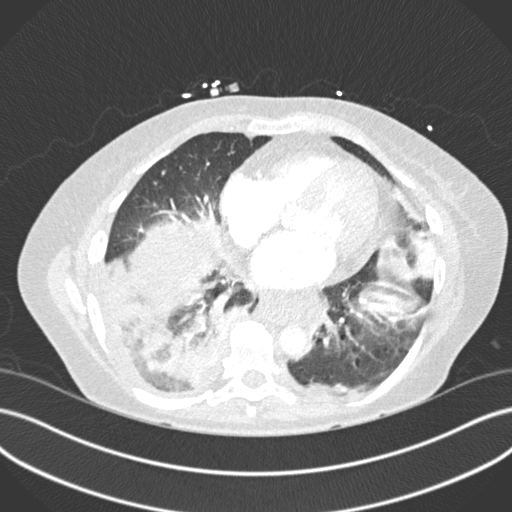
[im 90/230  soft-tissue]
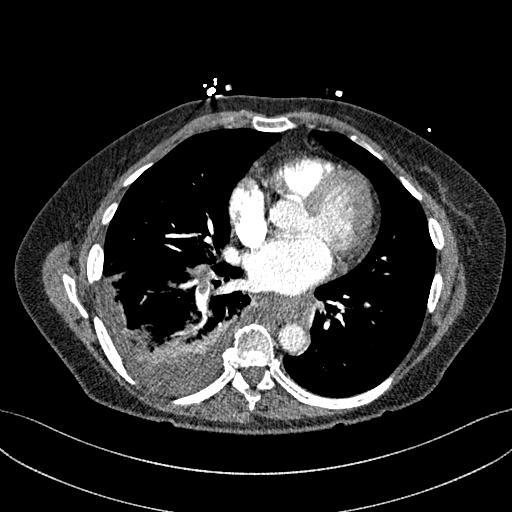
[im 110/230  lung]
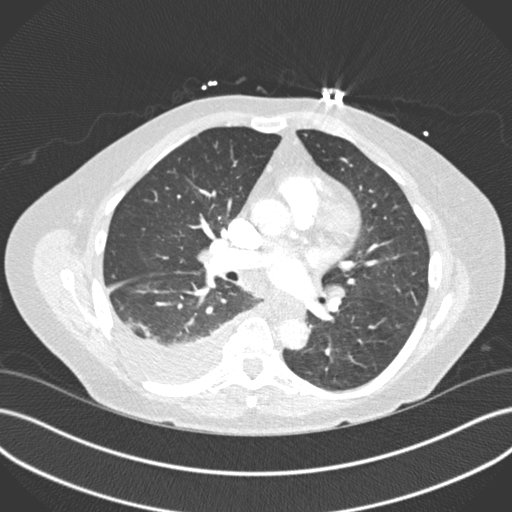
[im 120/230  soft-tissue]
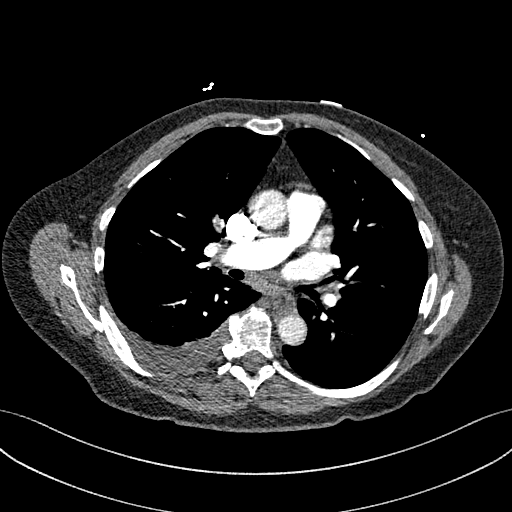
[im 140/230  lung]
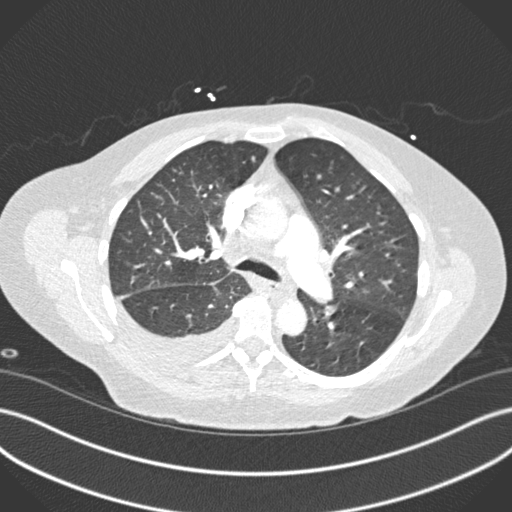
[im 150/230  soft-tissue]
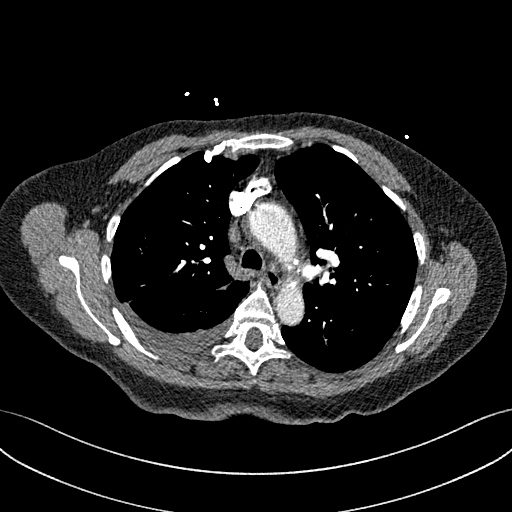
[im 170/230  lung]
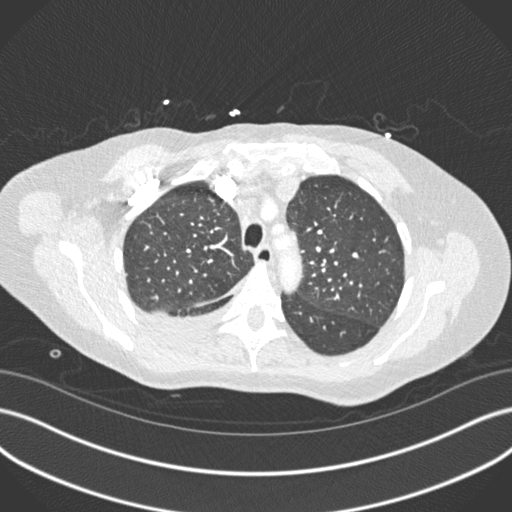
[im 190/230  soft-tissue]
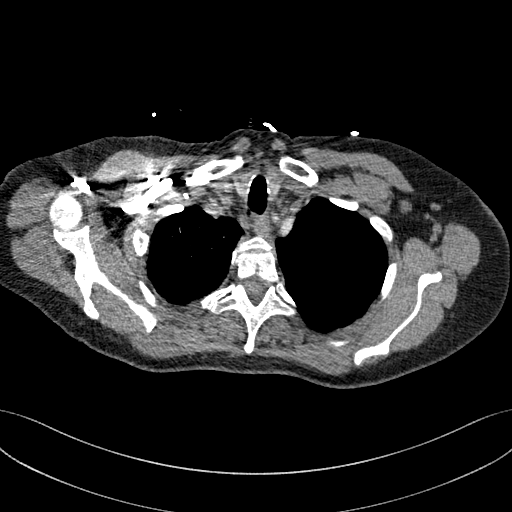
[im 200/230  lung]
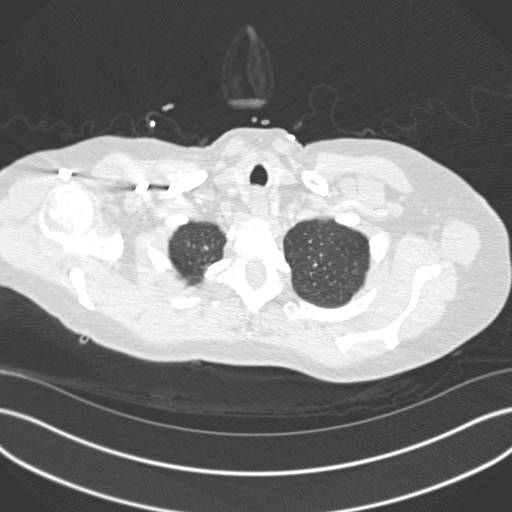
[im 220/230  soft-tissue]
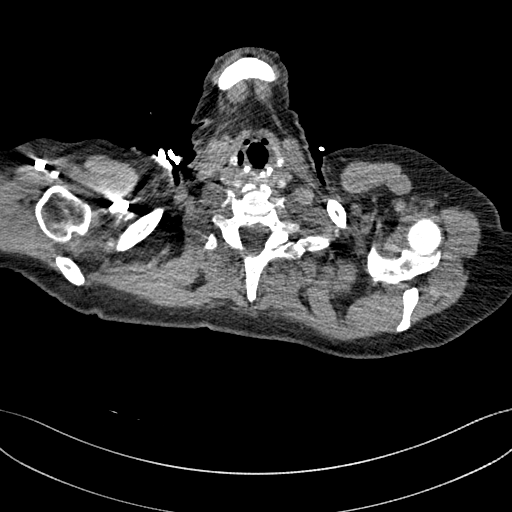

[Series 8: coronal mpr · coronal · 0.47mm/px · 3 of 132 slices shown]
[im 33/132  soft-tissue]
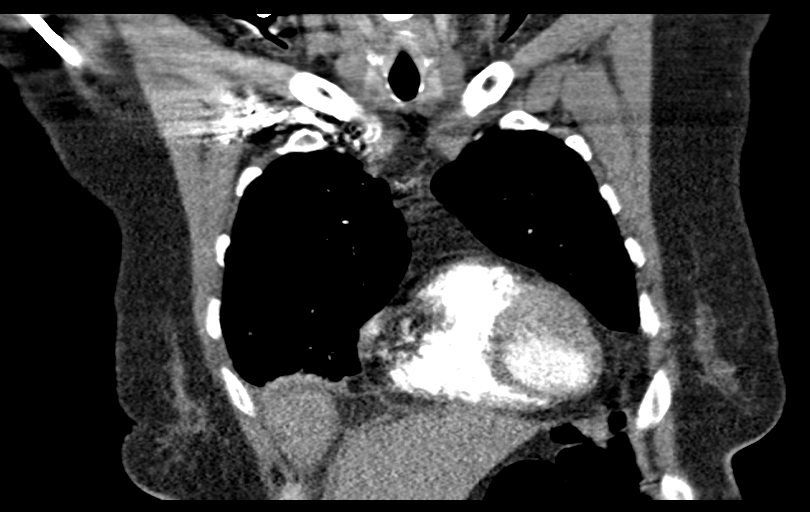
[im 66/132  soft-tissue]
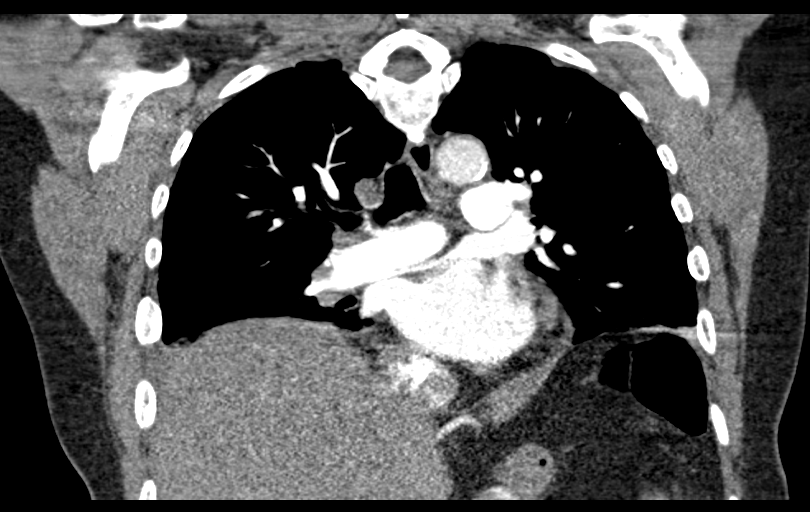
[im 99/132  soft-tissue]
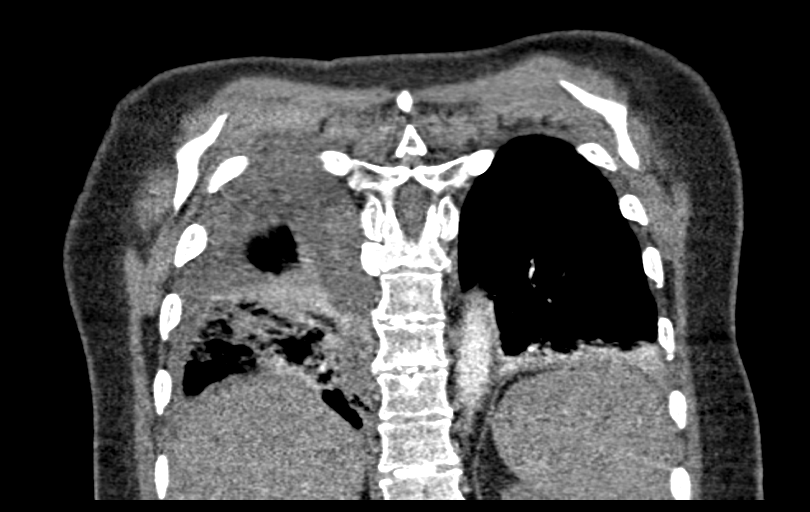

[17 of 46 positions shown; findings below may reference images not displayed]

FINDINGS: Cardiovascular: Positive for acute PE with filling defects in the
right lower lobe segmental and subsegmental branches. Additional
filling defects within the central right middle lobar branches. No
definite left-sided pulmonary emboli. Mild straightening of the
intraventricular septum with RV to LV ratio of 1.16. Mild aortic
atherosclerosis without aneurysm. Heart is normal in size. No
pericardial effusion.

Mediastinum/Nodes: Small mediastinal and hilar nodes, all
subcentimeter. No thyroid nodule. Small to moderate hiatal hernia
with minimal retained contents in the distal esophagus

Lungs/Pleura: Small to moderate right pleural effusion. Patchy and
consolidative opacities throughout the right lower lobe. Small left
pleural effusion. Adjacent compressive atelectasis cells linear
atelectasis in the lingula and left lower lobe. Breathing motion
artifact partially obscures evaluation of the lung bases. No
pulmonary edema. The trachea and mainstem bronchi are patent.

Upper Abdomen: No acute findings.

Musculoskeletal: Exaggerated thoracic kyphosis. Degenerative change
in the spine. There are no acute or suspicious osseous
abnormalities.

Review of the MIP images confirms the above findings.
IMPRESSION: 1. Positive for acute PE in the right lower and middle lobe. Mild
right heart strain with straightening of the intraventricular septum
and RV to LV ratio of 1.16.
2. Small to moderate right and small left pleural effusions. Patchy
and consolidative opacities in the right lower lobe may be
atelectasis or pneumonia. Distribution not classic for pulmonary
infarct. Breathing motion artifact through the bases partially
obscures evaluation.

Aortic Atherosclerosis ([id number]-VNO.O).

Critical Value/emergent results were called by telephone at the time
of interpretation on 08/31/2019 at [DATE] to Dr Gamaralalage , who
verbally acknowledged these results.

## 2020-01-13 IMAGING — DX DG CHEST 1V PORT
1 series · 1 of 1 positions shown · non-contrast
Comparison: August 30, 2019.

CLINICAL DATA: Pneumonia. G6CM7-XC positive.

EXAM:
PORTABLE CHEST 1 VIEW

[chest ap]
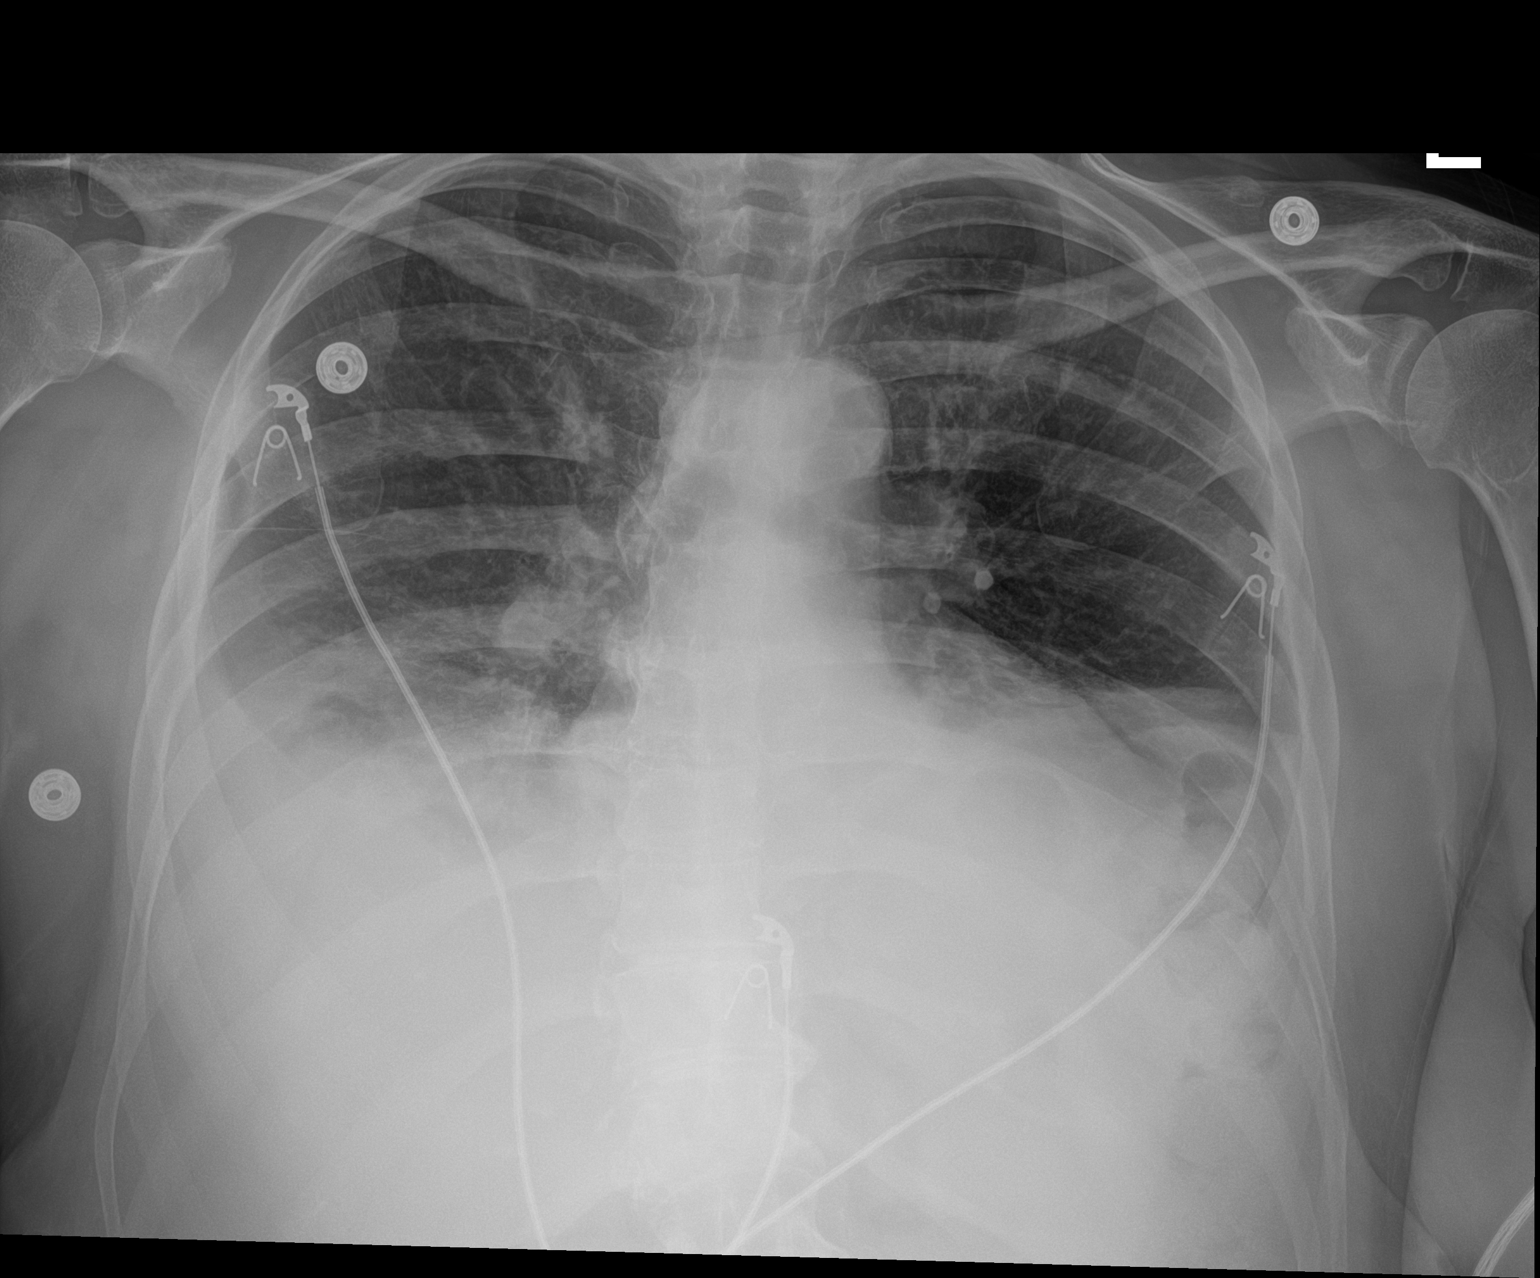

[1 of 1 positions shown; findings below may reference images not displayed]

FINDINGS: Stable cardiomediastinal silhouette. No pneumothorax is noted.
Hypoinflation of the lungs is noted with bibasilar atelectasis or
infiltrates and associated pleural effusions. Bony thorax is
unremarkable.
IMPRESSION: Hypoinflation of the lungs is noted with bibasilar atelectasis or
infiltrates and associated pleural effusions. No pneumothorax is
noted.

## 2020-01-15 IMAGING — DX DG CHEST 1V PORT
1 series · 1 of 1 positions shown · non-contrast
Comparison: Single-view of the chest 09/02/2019 and 08/30/2019.

CLINICAL DATA: Shortness of breath in a patient who is [id number]-JE
positive.

EXAM:
PORTABLE CHEST 1 VIEW

[chest ap]
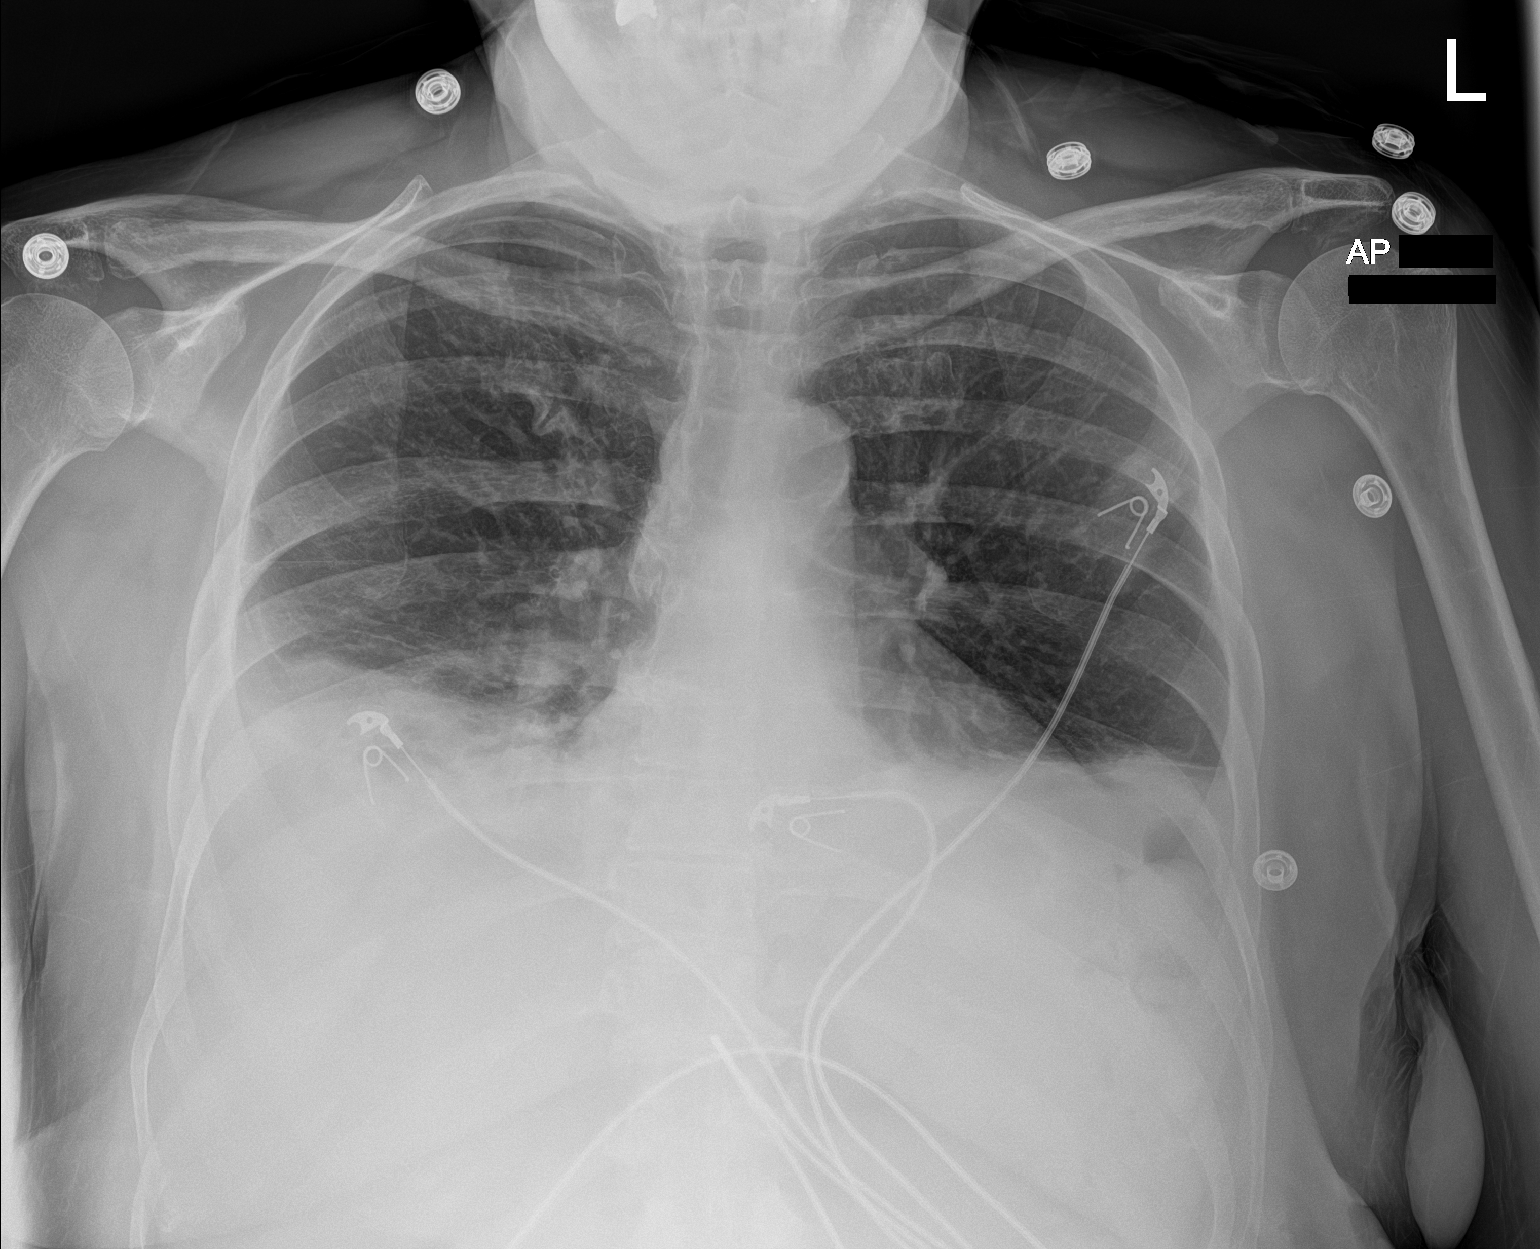

[1 of 1 positions shown; findings below may reference images not displayed]

FINDINGS: Small bilateral pleural effusions are again seen, greater on the
right. There is associated basilar atelectasis. Lungs otherwise
clear. No pneumothorax. Heart size is normal. Atherosclerosis noted.
IMPRESSION: No notable change in small bilateral pleural effusions and basilar
atelectasis.

## 2020-02-14 DIAGNOSIS — Z7982 Long term (current) use of aspirin: Secondary | ICD-10-CM | POA: Diagnosis not present

## 2020-02-14 DIAGNOSIS — Z79899 Other long term (current) drug therapy: Secondary | ICD-10-CM | POA: Diagnosis not present

## 2020-02-14 DIAGNOSIS — Z86711 Personal history of pulmonary embolism: Secondary | ICD-10-CM | POA: Diagnosis not present

## 2020-02-14 DIAGNOSIS — D473 Essential (hemorrhagic) thrombocythemia: Secondary | ICD-10-CM | POA: Diagnosis not present

## 2020-02-14 DIAGNOSIS — Z8616 Personal history of COVID-19: Secondary | ICD-10-CM | POA: Diagnosis not present

## 2020-05-02 DIAGNOSIS — I700 Atherosclerosis of aorta: Secondary | ICD-10-CM | POA: Diagnosis not present

## 2020-05-02 DIAGNOSIS — E7800 Pure hypercholesterolemia, unspecified: Secondary | ICD-10-CM | POA: Diagnosis not present

## 2020-05-02 DIAGNOSIS — Z6831 Body mass index (BMI) 31.0-31.9, adult: Secondary | ICD-10-CM | POA: Diagnosis not present

## 2020-05-02 DIAGNOSIS — Z0000 Encounter for general adult medical examination without abnormal findings: Secondary | ICD-10-CM | POA: Diagnosis not present

## 2020-05-02 DIAGNOSIS — Z79899 Other long term (current) drug therapy: Secondary | ICD-10-CM | POA: Diagnosis not present

## 2020-05-02 DIAGNOSIS — D473 Essential (hemorrhagic) thrombocythemia: Secondary | ICD-10-CM | POA: Diagnosis not present

## 2020-05-02 DIAGNOSIS — K219 Gastro-esophageal reflux disease without esophagitis: Secondary | ICD-10-CM | POA: Diagnosis not present

## 2020-05-02 DIAGNOSIS — E669 Obesity, unspecified: Secondary | ICD-10-CM | POA: Diagnosis not present

## 2020-05-02 DIAGNOSIS — I10 Essential (primary) hypertension: Secondary | ICD-10-CM | POA: Diagnosis not present

## 2020-08-01 ENCOUNTER — Other Ambulatory Visit: Payer: Self-pay | Admitting: Hematology and Oncology

## 2020-08-01 DIAGNOSIS — D473 Essential (hemorrhagic) thrombocythemia: Secondary | ICD-10-CM

## 2020-08-14 ENCOUNTER — Other Ambulatory Visit: Payer: Self-pay | Admitting: Oncology

## 2020-08-15 ENCOUNTER — Inpatient Hospital Stay: Payer: Medicare HMO

## 2020-08-15 ENCOUNTER — Inpatient Hospital Stay: Payer: Medicare HMO | Admitting: Oncology

## 2020-09-02 DIAGNOSIS — J22 Unspecified acute lower respiratory infection: Secondary | ICD-10-CM | POA: Diagnosis not present

## 2020-09-02 DIAGNOSIS — J069 Acute upper respiratory infection, unspecified: Secondary | ICD-10-CM | POA: Diagnosis not present

## 2020-11-02 ENCOUNTER — Other Ambulatory Visit: Payer: Self-pay | Admitting: Hematology and Oncology

## 2020-11-02 DIAGNOSIS — D473 Essential (hemorrhagic) thrombocythemia: Secondary | ICD-10-CM

## 2020-11-05 DIAGNOSIS — Z111 Encounter for screening for respiratory tuberculosis: Secondary | ICD-10-CM | POA: Diagnosis not present

## 2020-11-07 DIAGNOSIS — J0100 Acute maxillary sinusitis, unspecified: Secondary | ICD-10-CM | POA: Diagnosis not present

## 2020-11-11 ENCOUNTER — Telehealth: Payer: Self-pay | Admitting: Oncology

## 2020-11-11 NOTE — Telephone Encounter (Signed)
Per 3/7 Staff Msg, patient scheduled for 3/18 Labs 2:15 pm - Follow Up 2:45 pm

## 2020-11-14 NOTE — Progress Notes (Deleted)
[facility]  [address] [place],  [place]  [address] [phone]  Clinic Day:  11/14/2020  Referring physician: Greig Right, MD   HISTORY OF PRESENT ILLNESS:  The patient is a 70 y.o. female with essential thrombocythemia.  She takes hydroxyurea 500 mg twice daily on Mondays, Wednesdays and Fridays; she remains on hydroxyurea 500 mg daily for the other 4 days of the week.  This regimen has been effective in keeping her platelet count below 600.  She comes in today to reassess her peripheral counts.  Since her last visit, the patient has been doing well.  She denies having any new clotting complications since her last visit.    PHYSICAL EXAM:  There were no vitals taken for this visit. Wt Readings from Last 3 Encounters:  09/04/19 187 lb 12.8 oz (85.2 kg)   There is no height or weight on file to calculate BMI. Performance status (ECOG): {CHL ONC [id number] Physical Exam  LABS:   CBC Latest Ref Rng & Units 09/05/2019 09/04/2019 09/03/2019  WBC 4.0 - 10.5 K/uL 16.5(H) 15.6(H) 13.9(H)  Hemoglobin 12.0 - 15.0 g/dL 11.0(L) 10.9(L) 12.0  Hematocrit 36.0 - 46.0 % 33.7(L) 33.8(L) 36.8  Platelets 150 - 400 K/uL 440(H) 524(H) 512(H)   CMP Latest Ref Rng & Units 09/04/2019 09/03/2019 08/31/2019  Glucose 70 - 99 mg/dL 98 98 108(H)  BUN 8 - 23 mg/dL 14 15 11   Creatinine 0.44 - 1.00 mg/dL 0.86 1.01(H) 0.82  Sodium 135 - 145 mmol/L 139 140 138  Potassium 3.5 - 5.1 mmol/L 3.7 3.1(L) 3.1(L)  Chloride 98 - 111 mmol/L 102 101 99  CO2 22 - 32 mmol/L 27 29 25   Calcium 8.9 - 10.3 mg/dL 8.1(L) 8.3(L) 8.1(L)  Total Protein 6.5 - 8.1 g/dL 6.3(L) 6.6 7.1  Total Bilirubin 0.3 - 1.2 mg/dL 0.3 0.4 1.0  Alkaline Phos 38 - 126 U/L 67 70 72  AST 15 - 41 U/L 167(H) 184(H) 24  ALT 0 - 44 U/L 159(H) 127(H) 19     No results found for: CEA1 / No results found for: CEA1 No results found for: PSA1 No results found for: MCN470 No results found for: JGG836  No results  found for: TOTALPROTELP, ALBUMINELP, A1GS, A2GS, BETS, BETA2SER, GAMS, MSPIKE, SPEI Lab Results  Component Value Date   FERRITIN 881 (H) 09/05/2019   FERRITIN 1,017 (H) 09/04/2019   FERRITIN 1,316 (H) 09/03/2019   Lab Results  Component Value Date   LDH 328 (H) 08/30/2019       Component Value Date/Time   LDH 328 (H) 08/30/2019 1911    Recent Review Flowsheet Data    Oncology Labs Latest Ref Rng & Units 09/03/2019 09/04/2019 09/05/2019   FERRITIN 11 - 307 ng/mL 1,316(H) 1,017(H) 881(H)   LDH 98 - 192 U/L - - -       STUDIES:  No results found.    ASSESSMENT & PLAN:   Assessment/Plan:  A 70 y.o. female with essential thrombocythemia.  I am pleased as her platelet count remains below 600.  She will continue taking  hydroxyurea 500 mg twice daily on Mondays, Wednesdays, and Fridays, and 500 mg daily for the other 4 days of the week.  The goal remains to keep her platelet count below 400-600.  As mentioned previously, the patient was briefly on Xarelto earlier this year as she dealt with pulmonary emboli secondary to COVID.  Although she does have a history of essential thrombocythemia, which can trigger  blood clots, she never had blood clots previously until she was infected with COVID.  Now that her COVID has resolved, I do feel comfortable with her being back on a baby aspirin daily to prevent blood clots from developing from her underlying essential thrombocythemia.  Overall, the patient appears to be doing well.  As that is the case, I will see her back in 6 months for repeat clinical assessment.  .The patient understands all the plans discussed today and is in agreement with them.      Frisco Cordts Macarthur Critchley, MD

## 2020-11-15 ENCOUNTER — Ambulatory Visit: Payer: Medicare HMO | Admitting: Oncology

## 2020-11-15 ENCOUNTER — Other Ambulatory Visit: Payer: Medicare HMO

## 2020-11-20 ENCOUNTER — Telehealth: Payer: Self-pay | Admitting: Oncology

## 2020-11-20 NOTE — Telephone Encounter (Signed)
11/20/20 Spoke with patient and rs appt

## 2020-11-27 ENCOUNTER — Other Ambulatory Visit: Payer: Self-pay | Admitting: Oncology

## 2020-11-27 DIAGNOSIS — D473 Essential (hemorrhagic) thrombocythemia: Secondary | ICD-10-CM

## 2020-11-27 NOTE — Progress Notes (Signed)
[facility]  [address] [place],  [place]  [address] [phone]  Clinic Day:  11/28/2020  Referring physician: Greig Right, MD   HISTORY OF PRESENT ILLNESS:  The patient is a 70 y.o. female with essential thrombocythemia.  She takes hydroxyurea 500 mg twice daily on Mondays, Wednesdays and Fridays; she remains on hydroxyurea 500 mg daily for the other 4 days of the week.  This regimen has been effective in keeping her platelet count below 600.  She comes in today to reassess her peripheral counts.  Since her last visit, the patient has been doing well.  She denies having any new clotting complications since her last visit.     PHYSICAL EXAM:  Blood pressure (!) 158/74, pulse 64, temperature 98 F (36.7 C), resp. rate 16, height 5\' 6"  (1.676 m), weight 199 lb 9.6 oz (90.5 kg), SpO2 96 %. Wt Readings from Last 3 Encounters:  11/28/20 199 lb 9.6 oz (90.5 kg)  09/04/19 187 lb 12.8 oz (85.2 kg)   Body mass index is 32.22 kg/m. Performance status (ECOG): 0 - Asymptomatic Physical Exam Constitutional:      Appearance: Normal appearance. She is not ill-appearing.  HENT:     Mouth/Throat:     Mouth: Mucous membranes are moist.     Pharynx: Oropharynx is clear. No oropharyngeal exudate or posterior oropharyngeal erythema.  Cardiovascular:     Rate and Rhythm: Normal rate and regular rhythm.     Heart sounds: No murmur heard. No friction rub. No gallop.   Pulmonary:     Effort: Pulmonary effort is normal. No respiratory distress.     Breath sounds: Normal breath sounds. No wheezing, rhonchi or rales.  Chest:  Breasts:     Right: No axillary adenopathy or supraclavicular adenopathy.     Left: No axillary adenopathy or supraclavicular adenopathy.    Abdominal:     General: Bowel sounds are normal. There is no distension.     Palpations: Abdomen is soft. There is no mass.     Tenderness: There is no abdominal tenderness.  Musculoskeletal:         General: No swelling.     Right lower leg: No edema.     Left lower leg: No edema.  Lymphadenopathy:     Cervical: No cervical adenopathy.     Upper Body:     Right upper body: No supraclavicular or axillary adenopathy.     Left upper body: No supraclavicular or axillary adenopathy.     Lower Body: No right inguinal adenopathy. No left inguinal adenopathy.  Skin:    General: Skin is warm.     Coloration: Skin is not jaundiced.     Findings: No lesion or rash.  Neurological:     General: No focal deficit present.     Mental Status: She is alert and oriented to person, place, and time. Mental status is at baseline.     Cranial Nerves: Cranial nerves are intact.  Psychiatric:        Mood and Affect: Mood normal.        Behavior: Behavior normal.        Thought Content: Thought content normal.     LABS:     ASSESSMENT & PLAN:  Assessment/Plan:  A 70 y.o. female with essential thrombocythemia.  I am pleased as her platelet count remains below 600.  She will continue taking  hydroxyurea 500 mg twice daily on Mondays, Wednesdays, and Fridays, and 500  mg daily for the other 4 days of the week.  The goal remains to keep her platelet count below 400-600.  Overall, the patient appears to be doing well.  As that is the case, I will see her back in 6 months for repeat clinical assessment. The patient understands all the plans discussed today and is in agreement with them.    Joelynn Dust Macarthur Critchley, MD

## 2020-11-28 ENCOUNTER — Other Ambulatory Visit: Payer: Self-pay | Admitting: Hematology and Oncology

## 2020-11-28 ENCOUNTER — Other Ambulatory Visit: Payer: Self-pay | Admitting: Oncology

## 2020-11-28 ENCOUNTER — Other Ambulatory Visit: Payer: Self-pay

## 2020-11-28 ENCOUNTER — Inpatient Hospital Stay: Payer: Medicare HMO | Attending: Oncology | Admitting: Oncology

## 2020-11-28 ENCOUNTER — Telehealth: Payer: Self-pay | Admitting: Oncology

## 2020-11-28 ENCOUNTER — Inpatient Hospital Stay: Payer: Medicare HMO

## 2020-11-28 VITALS — BP 158/74 | HR 64 | Temp 98.0°F | Resp 16 | Ht 66.0 in | Wt 199.6 lb

## 2020-11-28 DIAGNOSIS — D473 Essential (hemorrhagic) thrombocythemia: Secondary | ICD-10-CM

## 2020-11-28 DIAGNOSIS — D649 Anemia, unspecified: Secondary | ICD-10-CM | POA: Diagnosis not present

## 2020-11-28 LAB — CBC AND DIFFERENTIAL
HCT: 39 (ref 36–46)
Hemoglobin: 12.7 (ref 12.0–16.0)
Neutrophils Absolute: 5.13
Platelets: 506 — AB (ref 150–399)
WBC: 8.7

## 2020-11-28 LAB — CBC: RBC: 4.2 (ref 3.87–5.11)

## 2020-11-28 NOTE — Telephone Encounter (Signed)
Per 3/31 LOS, patient scheduled for Sept Appt's.  Gave patient Appt Summary

## 2021-01-14 DIAGNOSIS — E7800 Pure hypercholesterolemia, unspecified: Secondary | ICD-10-CM | POA: Diagnosis not present

## 2021-01-14 DIAGNOSIS — I10 Essential (primary) hypertension: Secondary | ICD-10-CM | POA: Diagnosis not present

## 2021-01-14 DIAGNOSIS — E669 Obesity, unspecified: Secondary | ICD-10-CM | POA: Diagnosis not present

## 2021-01-14 DIAGNOSIS — Z6832 Body mass index (BMI) 32.0-32.9, adult: Secondary | ICD-10-CM | POA: Diagnosis not present

## 2021-01-14 DIAGNOSIS — K219 Gastro-esophageal reflux disease without esophagitis: Secondary | ICD-10-CM | POA: Diagnosis not present

## 2021-03-10 ENCOUNTER — Other Ambulatory Visit: Payer: Self-pay | Admitting: Family Medicine

## 2021-03-10 DIAGNOSIS — Z1231 Encounter for screening mammogram for malignant neoplasm of breast: Secondary | ICD-10-CM

## 2021-03-14 ENCOUNTER — Other Ambulatory Visit: Payer: Self-pay

## 2021-03-14 ENCOUNTER — Ambulatory Visit
Admission: RE | Admit: 2021-03-14 | Discharge: 2021-03-14 | Disposition: A | Discharge status: Home / Self Care | Payer: Medicare HMO | Source: Ambulatory Visit | Attending: Family Medicine | Admitting: Family Medicine

## 2021-03-14 DIAGNOSIS — Z1231 Encounter for screening mammogram for malignant neoplasm of breast: Secondary | ICD-10-CM | POA: Diagnosis not present

## 2021-04-30 DIAGNOSIS — I700 Atherosclerosis of aorta: Secondary | ICD-10-CM | POA: Diagnosis not present

## 2021-04-30 DIAGNOSIS — E7800 Pure hypercholesterolemia, unspecified: Secondary | ICD-10-CM | POA: Diagnosis not present

## 2021-04-30 DIAGNOSIS — D473 Essential (hemorrhagic) thrombocythemia: Secondary | ICD-10-CM | POA: Diagnosis not present

## 2021-04-30 DIAGNOSIS — K219 Gastro-esophageal reflux disease without esophagitis: Secondary | ICD-10-CM | POA: Diagnosis not present

## 2021-04-30 DIAGNOSIS — I10 Essential (primary) hypertension: Secondary | ICD-10-CM | POA: Diagnosis not present

## 2021-05-26 ENCOUNTER — Telehealth: Payer: Self-pay | Admitting: Oncology

## 2021-05-30 ENCOUNTER — Other Ambulatory Visit: Payer: Medicare HMO

## 2021-05-30 ENCOUNTER — Ambulatory Visit: Payer: Self-pay | Admitting: Oncology

## 2021-06-06 ENCOUNTER — Ambulatory Visit: Payer: Self-pay | Admitting: Oncology

## 2021-06-06 ENCOUNTER — Other Ambulatory Visit: Payer: Medicare HMO

## 2021-06-12 ENCOUNTER — Other Ambulatory Visit: Payer: Self-pay | Admitting: Hematology and Oncology

## 2021-06-12 ENCOUNTER — Inpatient Hospital Stay: Payer: Medicare HMO

## 2021-06-12 ENCOUNTER — Telehealth: Payer: Self-pay | Admitting: Hematology and Oncology

## 2021-06-12 ENCOUNTER — Encounter: Payer: Self-pay | Admitting: Hematology and Oncology

## 2021-06-12 ENCOUNTER — Inpatient Hospital Stay: Payer: Medicare HMO | Attending: Oncology | Admitting: Hematology and Oncology

## 2021-06-12 DIAGNOSIS — D473 Essential (hemorrhagic) thrombocythemia: Secondary | ICD-10-CM | POA: Diagnosis not present

## 2021-06-12 LAB — CBC AND DIFFERENTIAL
HCT: 41 (ref 36–46)
Hemoglobin: 13.8 (ref 12.0–16.0)
Neutrophils Absolute: 8.69
Platelets: 744 — AB (ref 150–399)
WBC: 12.6

## 2021-06-12 LAB — CBC
MCV: 92 (ref 81–99)
RBC: 4.43 (ref 3.87–5.11)

## 2021-06-12 MED ORDER — HYDROXYUREA 500 MG PO CAPS: 500.0000 mg | ORAL_CAPSULE | Freq: Two times a day (BID) | ORAL | 3 refills | Status: DC

## 2021-06-12 NOTE — Assessment & Plan Note (Addendum)
She is currently taking Hydroxyurea 500 mg twice daily 3 days a week and daily 4 days a week. We will increase that to Hydroxyurea 500 mg twice daily for 4 days a week and daily 3 days a week as her platelet count has increased to 744 today. We will repeat evaluation in 4 weeks.

## 2021-06-12 NOTE — Telephone Encounter (Signed)
Per 10/13 LOS NEXT APPT SCHEDULED AND GIVEN TO PATIENT

## 2021-06-12 NOTE — Progress Notes (Signed)
[name]  [address] [place],  [place]  [address] [phone]  Clinic Day:  06/12/2021  Referring physician: Greig Right, MD  ASSESSMENT & PLAN:   Assessment & Plan: Essential thrombocythemia [facility]) She is currently taking Hydroxyurea 500 mg twice daily 3 days a week and daily 4 days a week. We will increase that to Hydroxyurea 500 mg twice daily for 4 days a week and daily 3 days a week as her platelet count has increased to 744 today. We will repeat evaluation in 4 weeks.     The patient understands the plans discussed today and is in agreement with them.  She knows to contact our office if she develops concerns prior to her next appointment.     [name], NP  [facility] [address] [place] [address] Dept: [phone] Dept Fax: [phone]   No orders of the defined types were placed in this encounter.     CHIEF COMPLAINT:  CC: A 70 year old female with history of essential thrombocythemia here for repeat evaluation.  Current Treatment:  Hydroxyurea   HISTORY OF PRESENT ILLNESS:   Oncology History   No history exists.     INTERVAL HISTORY:  Anne [name] is here today for repeat clinical assessment. She has been well since last visit. She denies fever, chills, nausea or vomiting. She denies shortness of breath, chest pain or cough. She denies issue with bowel or bladder. CBC today reveals platelets 744.  REVIEW OF SYSTEMS:  Review of Systems  Constitutional:  Negative for appetite change, chills, diaphoresis, fatigue, fever and unexpected weight change.  HENT:   Negative for hearing loss, lump/mass, mouth sores, nosebleeds, sore throat, tinnitus, trouble swallowing and voice change.   Eyes:  Negative for eye problems and icterus.  Respiratory:  Negative for chest tightness, cough, hemoptysis, shortness of breath and wheezing.    Cardiovascular:  Negative for chest pain, leg swelling and palpitations.  Gastrointestinal:  Negative for abdominal distention, abdominal pain, blood in stool, constipation, diarrhea, nausea, rectal pain and vomiting.  Endocrine: Negative for hot flashes.  Genitourinary:  Negative for bladder incontinence, difficulty urinating, dyspareunia, dysuria, frequency, hematuria and nocturia.   Musculoskeletal:  Negative for arthralgias, back pain, flank pain, gait problem, myalgias, neck pain and neck stiffness.  Skin:  Negative for itching, rash and wound.  Neurological:  Negative for dizziness, extremity weakness, gait problem, headaches, light-headedness, numbness, seizures and speech difficulty.  Hematological:  Negative for adenopathy. Does not bruise/bleed easily.  Psychiatric/Behavioral:  Negative for confusion, decreased concentration, depression, sleep disturbance and suicidal ideas. The patient is not nervous/anxious.     VITALS:  There were no vitals taken for this visit.  Wt Readings from Last 3 Encounters:  11/28/20 199 lb 9.6 oz (90.5 kg)  09/04/19 187 lb 12.8 oz (85.2 kg)    There is no height or weight on file to calculate BMI.  Performance status (ECOG): 1 - Symptomatic but completely ambulatory  PHYSICAL EXAM:  Physical Exam Constitutional:      General: She is not in acute distress.    Appearance: Normal appearance. She is normal weight. She is not ill-appearing, toxic-appearing or diaphoretic.  HENT:     Head: Normocephalic and atraumatic.     Nose: Nose normal. No congestion or rhinorrhea.     Mouth/Throat:     Mouth: Mucous membranes are moist.     Pharynx: Oropharynx is clear. No oropharyngeal  exudate or posterior oropharyngeal erythema.  Eyes:     General: No scleral icterus.       Right eye: No discharge.        Left eye: No discharge.     Extraocular Movements: Extraocular movements intact.     Conjunctiva/sclera: Conjunctivae normal.     Pupils: Pupils are  equal, round, and reactive to light.  Neck:     Vascular: No carotid bruit.  Cardiovascular:     Rate and Rhythm: Normal rate and regular rhythm.     Heart sounds: No murmur heard.   No friction rub. No gallop.  Pulmonary:     Effort: Pulmonary effort is normal. No respiratory distress.     Breath sounds: Normal breath sounds. No stridor. No wheezing, rhonchi or rales.  Chest:     Chest wall: No tenderness.  Abdominal:     General: Abdomen is flat. Bowel sounds are normal. There is no distension.     Palpations: There is no mass.     Tenderness: There is no abdominal tenderness. There is no right CVA tenderness, left CVA tenderness, guarding or rebound.     Hernia: No hernia is present.  Musculoskeletal:        General: No swelling, tenderness, deformity or signs of injury. Normal range of motion.     Cervical back: Normal range of motion and neck supple. No rigidity or tenderness.     Right lower leg: No edema.     Left lower leg: No edema.  Lymphadenopathy:     Cervical: No cervical adenopathy.  Skin:    General: Skin is warm and dry.     Capillary Refill: Capillary refill takes less than 2 seconds.     Coloration: Skin is not jaundiced or pale.     Findings: No bruising, erythema, lesion or rash.  Neurological:     General: No focal deficit present.     Mental Status: She is alert and oriented to person, place, and time. Mental status is at baseline.     Cranial Nerves: No cranial nerve deficit.     Sensory: No sensory deficit.     Motor: No weakness.     Coordination: Coordination normal.     Gait: Gait normal.     Deep Tendon Reflexes: Reflexes normal.  Psychiatric:        Mood and Affect: Mood normal.        Behavior: Behavior normal.        Thought Content: Thought content normal.        Judgment: Judgment normal.    LABS:   CBC Latest Ref Rng & Units 06/12/2021 11/28/2020 09/05/2019  WBC - 12.6 8.7 16.5(H)  Hemoglobin 12.0 - 16.0 13.8 12.7 11.0(L)  Hematocrit 36  - 46 41 39 33.7(L)  Platelets 150 - 399 744(A) 506(A) 440(H)   CMP Latest Ref Rng & Units 09/04/2019 09/03/2019 08/31/2019  Glucose 70 - 99 mg/dL 98 98 108(H)  BUN 8 - 23 mg/dL 14 15 11   Creatinine 0.44 - 1.00 mg/dL 0.86 1.01(H) 0.82  Sodium 135 - 145 mmol/L 139 140 138  Potassium 3.5 - 5.1 mmol/L 3.7 3.1(L) 3.1(L)  Chloride 98 - 111 mmol/L 102 101 99  CO2 22 - 32 mmol/L 27 29 25   Calcium 8.9 - 10.3 mg/dL 8.1(L) 8.3(L) 8.1(L)  Total Protein 6.5 - 8.1 g/dL 6.3(L) 6.6 7.1  Total Bilirubin 0.3 - 1.2 mg/dL 0.3 0.4 1.0  Alkaline Phos 38 - 126 U/L 67  70 72  AST 15 - 41 U/L 167(H) 184(H) 24  ALT 0 - 44 U/L 159(H) 127(H) 19     No results found for: CEA1 / No results found for: CEA1 No results found for: PSA1 No results found for: QBH419 No results found for: FXT024  No results found for: TOTALPROTELP, ALBUMINELP, A1GS, A2GS, BETS, BETA2SER, GAMS, MSPIKE, SPEI Lab Results  Component Value Date   FERRITIN 881 (H) 09/05/2019   FERRITIN 1,017 (H) 09/04/2019   FERRITIN 1,316 (H) 09/03/2019   Lab Results  Component Value Date   LDH 328 (H) 08/30/2019    STUDIES:  No results found.    HISTORY:   Past Medical History:  Diagnosis Date   Asthma    GERD (gastroesophageal reflux disease)     Past Surgical History:  Procedure Laterality Date   APPENDECTOMY     BREAST EXCISIONAL BIOPSY Right    20 years ago- benign    Family History  Problem Relation Age of Onset   Diabetes Mellitus II Other     Social History:  reports that she has never smoked. She has never used smokeless tobacco. She reports that she does not drink alcohol and does not use drugs.The patient is alone  today.  Allergies: No Known Allergies  Current Medications: Current Outpatient Medications  Medication Sig Dispense Refill   hydroxyurea (HYDREA) 500 MG capsule Take 1 capsule (500 mg total) by mouth 2 (two) times daily. May take with food to minimize GI side effects. 60 capsule 3   albuterol (PROVENTIL  HFA;VENTOLIN HFA) 108 (90 BASE) MCG/ACT inhaler Inhale 2 puffs into the lungs every 6 (six) hours as needed for wheezing.     amLODipine (NORVASC) 10 MG tablet Take 10 mg by mouth daily.     calcium carbonate (TUMS - DOSED IN MG ELEMENTAL CALCIUM) 500 MG chewable tablet Chew 1 tablet by mouth daily.     dexamethasone (DECADRON) 6 MG tablet Take 1 tablet (6 mg total) by mouth 2 (two) times daily. 8 tablet 0   docusate sodium (COLACE) 100 MG capsule Take 200 mg by mouth at bedtime.     famotidine (PEPCID) 20 MG tablet Take 20 mg by mouth daily.     HYDROcodone-acetaminophen (NORCO/VICODIN) 5-325 MG per tablet Take 1 tablet by mouth every 6 (six) hours as needed for pain. 7 tablet 0   hydroxyurea (HYDREA) 500 MG capsule TAKE 1 CAPSULE TWICE DAILY - MON,WED,FRI AND 1 CAPSULE DAILY - TUE,THU,SAT,SUN 120 capsule 3   lansoprazole (PREVACID) 30 MG capsule Take 30 mg by mouth daily.     Multiple Vitamin (MULTIVITAMIN WITH MINERALS) TABS Take 1 tablet by mouth daily.     ondansetron (ZOFRAN) 4 MG tablet Take 1 tablet (4 mg total) by mouth every 6 (six) hours as needed for nausea. 20 tablet 0   Rivaroxaban 15 & 20 MG TBPK Follow package directions: Take one 15mg  tablet by mouth twice a day. On day 22, switch to one 20mg  tablet once a day. Take with food. 51 each 0   saccharomyces boulardii (FLORASTOR) 250 MG capsule Take 1 capsule (250 mg total) by mouth 2 (two) times daily.     No current facility-administered medications for this visit.

## 2021-07-03 NOTE — Progress Notes (Incomplete)
Anne [name]  [address] [place],  [place]  [address] [phone]  Clinic Day:  07/03/2021  Referring physician: Greig Right, MD  This document serves as a record of services personally performed by [place] Macarthur Critchley, MD. It was created on their behalf by [facility] E, a trained medical scribe. The creation of this record is based on the scribe's personal observations and the provider's statements to them.  HISTORY OF PRESENT ILLNESS:  The patient is a 70 y.o. female with essential thrombocythemia.  She takes hydroxyurea 500 mg twice daily on Mondays, Wednesdays and Fridays; she remains on hydroxyurea 500 mg daily for the other 4 days of the week.  This regimen has been effective in keeping her platelet count below 600.  She comes in today to reassess her peripheral counts.  Since her last visit, the patient has been doing well.  She denies having any new clotting complications since her last visit.    PHYSICAL EXAM:  There were no vitals taken for this visit. Wt Readings from Last 3 Encounters:  06/12/21 197 lb 6.4 oz (89.5 kg)  11/28/20 199 lb 9.6 oz (90.5 kg)  09/04/19 187 lb 12.8 oz (85.2 kg)   There is no height or weight on file to calculate BMI. Performance status (ECOG): 0 - Asymptomatic Physical Exam Constitutional:      Appearance: Normal appearance. She is not ill-appearing.  HENT:     Mouth/Throat:     Mouth: Mucous membranes are moist.     Pharynx: Oropharynx is clear. No oropharyngeal exudate or posterior oropharyngeal erythema.  Cardiovascular:     Rate and Rhythm: Normal rate and regular rhythm.     Heart sounds: No murmur heard.   No friction rub. No gallop.  Pulmonary:     Effort: Pulmonary effort is normal. No respiratory distress.     Breath sounds: Normal breath sounds. No wheezing, rhonchi or rales.  Abdominal:     General: Bowel sounds are normal. There is no distension.     Palpations: Abdomen is soft. There is no  mass.     Tenderness: There is no abdominal tenderness.  Musculoskeletal:        General: No swelling.     Right lower leg: No edema.     Left lower leg: No edema.  Lymphadenopathy:     Cervical: No cervical adenopathy.     Upper Body:     Right upper body: No supraclavicular or axillary adenopathy.     Left upper body: No supraclavicular or axillary adenopathy.     Lower Body: No right inguinal adenopathy. No left inguinal adenopathy.  Skin:    General: Skin is warm.     Coloration: Skin is not jaundiced.     Findings: No lesion or rash.  Neurological:     General: No focal deficit present.     Mental Status: She is alert and oriented to person, place, and time. Mental status is at baseline.  Psychiatric:        Mood and Affect: Mood normal.        Behavior: Behavior normal.        Thought Content: Thought content normal.    LABS:     ASSESSMENT & PLAN:  Assessment/Plan:  A 70 y.o. female with essential thrombocythemia.  I am pleased as her platelet count remains below 600.  She will continue taking  hydroxyurea 500 mg twice daily on Mondays, Wednesdays, and Fridays, and 500 mg  daily for the other 4 days of the week.  The goal remains to keep her platelet count below 400-600.  Overall, the patient appears to be doing well.  As that is the case, I will see her back in 6 months for repeat clinical assessment. The patient understands all the plans discussed today and is in agreement with them.     I, Rita Ohara, am acting as scribe for Marice Potter, MD    I have reviewed this report as typed by the medical scribe, and it is complete and accurate.  [place] Macarthur Critchley, MD

## 2021-07-08 DIAGNOSIS — J069 Acute upper respiratory infection, unspecified: Secondary | ICD-10-CM | POA: Diagnosis not present

## 2021-07-11 ENCOUNTER — Ambulatory Visit: Payer: Self-pay | Admitting: Oncology

## 2021-07-11 ENCOUNTER — Other Ambulatory Visit: Payer: Medicare HMO

## 2021-07-16 NOTE — Progress Notes (Signed)
[facility]  [address] [place],  [place]  [address] [phone]  Clinic Day:  07/17/2021  Referring physician: Greig Right, MD  This document serves as a record of services personally performed by Dequincy Macarthur Critchley, MD. It was created on their behalf by [facility] E, a trained medical scribe. The creation of this record is based on the scribe's personal observations and the provider's statements to them.  HISTORY OF PRESENT ILLNESS:  The patient is a 70 y.o. female with essential thrombocythemia.  She takes hydroxyurea 500 mg twice daily on Mondays, Wednesdays, Fridays, and Saturdays; she remains on hydroxyurea 500 mg daily for the other 3 days of the week.  This regimen has been effective in keeping her platelet count below 600.  She comes in today to reassess her peripheral counts.  Since her last visit, the patient has been doing okay.  She recently has been dealing with the flu.  She denies having any new clotting complications since her last visit.   PHYSICAL EXAM:  Blood pressure (!) 187/81, pulse 69, temperature 97.6 F (36.4 C), resp. rate 16, height 5\' 6"  (1.676 m), weight 192 lb (87.1 kg), SpO2 98 %. Wt Readings from Last 3 Encounters:  07/17/21 192 lb (87.1 kg)  06/12/21 197 lb 6.4 oz (89.5 kg)  11/28/20 199 lb 9.6 oz (90.5 kg)   Body mass index is 30.99 kg/m. Performance status (ECOG): 0 - Asymptomatic Physical Exam Constitutional:      Appearance: Normal appearance. She is not ill-appearing.  HENT:     Mouth/Throat:     Mouth: Mucous membranes are moist.     Pharynx: Oropharynx is clear. No oropharyngeal exudate or posterior oropharyngeal erythema.  Cardiovascular:     Rate and Rhythm: Normal rate and regular rhythm.     Heart sounds: No murmur heard.   No friction rub. No gallop.  Pulmonary:     Effort: Pulmonary effort is normal. No respiratory distress.     Breath sounds: Normal breath sounds. No wheezing, rhonchi or  rales.  Abdominal:     General: Bowel sounds are normal. There is no distension.     Palpations: Abdomen is soft. There is no mass.     Tenderness: There is no abdominal tenderness.  Musculoskeletal:        General: No swelling.     Right lower leg: No edema.     Left lower leg: No edema.  Lymphadenopathy:     Cervical: No cervical adenopathy.     Upper Body:     Right upper body: No supraclavicular or axillary adenopathy.     Left upper body: No supraclavicular or axillary adenopathy.     Lower Body: No right inguinal adenopathy. No left inguinal adenopathy.  Skin:    General: Skin is warm.     Coloration: Skin is not jaundiced.     Findings: No lesion or rash.  Neurological:     General: No focal deficit present.     Mental Status: She is alert and oriented to person, place, and time. Mental status is at baseline.  Psychiatric:        Mood and Affect: Mood normal.        Behavior: Behavior normal.        Thought Content: Thought content normal.    LABS:    Latest Reference Range & Units 07/17/21 00:00  WBC  11.3 (E)  RBC 3.87 - 5.11  4.42 (E)  Hemoglobin 12.0 -  16.0  13.5 (E)  HCT 36 - 46  41 (E)  MCV 81 - 99  94 (E)  Platelets 150 - 399  562 ! (E)  NEUT#  8.02 (E)   ASSESSMENT & PLAN:  Assessment/Plan:  A 70 y.o. female with essential thrombocythemia.  I am pleased as her platelet count remains below 600.  She will continue taking  hydroxyurea 500 mg twice daily on Mondays, Wednesdays, Fridays and Saturdays; she will take 500 mg daily for the other 3 days of the week.  The goal remains to keep her platelet count below 400-600.  Overall, the patient appears to be doing well.  I will see her back in 6 months for repeat clinical assessment. The patient understands all the plans discussed today and is in agreement with them.     I, Rita Ohara, am acting as scribe for Marice Potter, MD    I have reviewed this report as typed by the medical scribe, and it is complete  and accurate.  Dequincy Macarthur Critchley, MD

## 2021-07-17 ENCOUNTER — Other Ambulatory Visit: Payer: Self-pay

## 2021-07-17 ENCOUNTER — Inpatient Hospital Stay: Payer: Medicare HMO | Attending: Oncology | Admitting: Oncology

## 2021-07-17 ENCOUNTER — Inpatient Hospital Stay: Payer: Medicare HMO

## 2021-07-17 ENCOUNTER — Other Ambulatory Visit: Payer: Self-pay | Admitting: Oncology

## 2021-07-17 ENCOUNTER — Other Ambulatory Visit: Payer: Self-pay | Admitting: Hematology and Oncology

## 2021-07-17 VITALS — BP 187/81 | HR 69 | Temp 97.6°F | Resp 16 | Ht 66.0 in | Wt 192.0 lb

## 2021-07-17 DIAGNOSIS — D473 Essential (hemorrhagic) thrombocythemia: Secondary | ICD-10-CM | POA: Diagnosis not present

## 2021-07-17 DIAGNOSIS — D649 Anemia, unspecified: Secondary | ICD-10-CM | POA: Diagnosis not present

## 2021-07-17 LAB — CBC AND DIFFERENTIAL
HCT: 41 (ref 36–46)
Hemoglobin: 13.5 (ref 12.0–16.0)
Neutrophils Absolute: 8.02
Platelets: 562 — AB (ref 150–399)
WBC: 11.3

## 2021-07-17 LAB — CBC
MCV: 94 (ref 81–99)
RBC: 4.42 (ref 3.87–5.11)

## 2021-07-30 ENCOUNTER — Telehealth: Payer: Self-pay | Admitting: Oncology

## 2021-07-30 NOTE — Telephone Encounter (Signed)
Per 11/17 LOS, patient scheduled for May Appt's

## 2021-09-17 ENCOUNTER — Other Ambulatory Visit: Payer: Self-pay | Admitting: Hematology and Oncology

## 2021-09-17 ENCOUNTER — Other Ambulatory Visit: Payer: Self-pay

## 2021-09-17 DIAGNOSIS — D473 Essential (hemorrhagic) thrombocythemia: Secondary | ICD-10-CM

## 2021-09-17 MED ORDER — HYDROXYUREA 500 MG PO CAPS: ORAL_CAPSULE | ORAL | 2 refills | Status: DC

## 2022-01-08 DIAGNOSIS — H5213 Myopia, bilateral: Secondary | ICD-10-CM | POA: Diagnosis not present

## 2022-01-08 DIAGNOSIS — Z0100 Encounter for examination of eyes and vision without abnormal findings: Secondary | ICD-10-CM | POA: Diagnosis not present

## 2022-01-14 ENCOUNTER — Other Ambulatory Visit: Payer: BC Managed Care – PPO

## 2022-01-14 ENCOUNTER — Ambulatory Visit: Payer: Self-pay | Admitting: Oncology

## 2022-02-08 NOTE — Progress Notes (Signed)
[facility]  [address] [place],  [place]  [address] [phone]  Clinic Day:  02/09/2022  Referring physician: Greig Right, MD  HISTORY OF PRESENT ILLNESS:  The patient is a 71 y.o. female with essential thrombocythemia.  She takes hydroxyurea 500 mg twice daily on Mondays, Wednesdays, Fridays, and Saturdays; she remains on hydroxyurea 500 mg daily for the other 3 days of the week.  This regimen has been effective in keeping her platelet count below 600.  She comes in today to reassess her peripheral counts.  Since her last visit, the patient has been doing okay.  The patient readily admits that she frequently misses her weekend doses of hydroxyurea.  However, she denies having any clotting complications related to her underlying essential thrombocythemia.  PHYSICAL EXAM:  Blood pressure (!) 165/77, pulse 74, temperature 97.6 F (36.4 C), resp. rate 14, height '5\' 6"'$  (1.676 m), weight 195 lb 12.8 oz (88.8 kg), SpO2 98 %. Wt Readings from Last 3 Encounters:  02/09/22 195 lb 12.8 oz (88.8 kg)  07/17/21 192 lb (87.1 kg)  06/12/21 197 lb 6.4 oz (89.5 kg)   Body mass index is 31.6 kg/m. Performance status (ECOG): 0 - Asymptomatic Physical Exam Constitutional:      Appearance: Normal appearance. She is not ill-appearing.  HENT:     Mouth/Throat:     Mouth: Mucous membranes are moist.     Pharynx: Oropharynx is clear. No oropharyngeal exudate or posterior oropharyngeal erythema.  Cardiovascular:     Rate and Rhythm: Normal rate and regular rhythm.     Heart sounds: No murmur heard.    No friction rub. No gallop.  Pulmonary:     Effort: Pulmonary effort is normal. No respiratory distress.     Breath sounds: Normal breath sounds. No wheezing, rhonchi or rales.  Abdominal:     General: Bowel sounds are normal. There is no distension.     Palpations: Abdomen is soft. There is no mass.     Tenderness: There is no abdominal tenderness.   Musculoskeletal:        General: No swelling.     Right lower leg: No edema.     Left lower leg: No edema.  Lymphadenopathy:     Cervical: No cervical adenopathy.     Upper Body:     Right upper body: No supraclavicular or axillary adenopathy.     Left upper body: No supraclavicular or axillary adenopathy.     Lower Body: No right inguinal adenopathy. No left inguinal adenopathy.  Skin:    General: Skin is warm.     Coloration: Skin is not jaundiced.     Findings: No lesion or rash.  Neurological:     General: No focal deficit present.     Mental Status: She is alert and oriented to person, place, and time. Mental status is at baseline.  Psychiatric:        Mood and Affect: Mood normal.        Behavior: Behavior normal.        Thought Content: Thought content normal.     LABS:    Latest Reference Range & Units 02/09/22 00:00  WBC  11.8 (E)  RBC 3.87 - 5.11  4.59 (E)  Hemoglobin 12.0 - 16.0  13.5 (E)  HCT 36 - 46  43 (E)  Platelets 150 - 400 K/uL 676 ! (E)  !: Data is abnormal (E): External lab result  ASSESSMENT & PLAN:  Assessment/Plan:  A 71 y.o. female with essential thrombocythemia.  Her labs today show that her platelet count is above 600.  As mentioned previously, she admits to not being very compliant with her weekend doses of hydroxyurea.  To counteract this, I will change her hydroxyurea schedule to where she will take 500 mg twice daily Monday to Friday only.  She knows to continue taking a baby aspirin on a daily basis to offset any potential clotting complications her essential thrombocythemia can cause.  I will see her back in 3 months to reassess her labs to see how well she has responded to the change in her hydroxyurea dose.  The patient understands all the plans discussed today and is in agreement with them.    Kahleah Crass Macarthur Critchley, MD

## 2022-02-09 ENCOUNTER — Inpatient Hospital Stay: Payer: Medicare HMO | Attending: Oncology

## 2022-02-09 ENCOUNTER — Other Ambulatory Visit: Payer: Self-pay | Admitting: Oncology

## 2022-02-09 ENCOUNTER — Inpatient Hospital Stay (INDEPENDENT_AMBULATORY_CARE_PROVIDER_SITE_OTHER): Payer: Medicare HMO | Admitting: Oncology

## 2022-02-09 ENCOUNTER — Other Ambulatory Visit: Payer: Self-pay

## 2022-02-09 ENCOUNTER — Telehealth: Payer: Self-pay | Admitting: Oncology

## 2022-02-09 VITALS — BP 165/77 | HR 74 | Temp 97.6°F | Resp 14 | Ht 66.0 in | Wt 195.8 lb

## 2022-02-09 DIAGNOSIS — D473 Essential (hemorrhagic) thrombocythemia: Secondary | ICD-10-CM

## 2022-02-09 LAB — CBC AND DIFFERENTIAL
HCT: 43 (ref 36–46)
Hemoglobin: 13.5 (ref 12.0–16.0)
Neutrophils Absolute: 8.73
Platelets: 676 10*3/uL — AB (ref 150–400)
WBC: 11.8

## 2022-02-09 LAB — CBC: RBC: 4.59 (ref 3.87–5.11)

## 2022-02-09 NOTE — Telephone Encounter (Signed)
Per 02/09/22 los next appt scheduled and confirmed with patient

## 2022-04-03 ENCOUNTER — Other Ambulatory Visit: Payer: Self-pay | Admitting: Hematology and Oncology

## 2022-04-03 DIAGNOSIS — D473 Essential (hemorrhagic) thrombocythemia: Secondary | ICD-10-CM

## 2022-05-07 DIAGNOSIS — J069 Acute upper respiratory infection, unspecified: Secondary | ICD-10-CM | POA: Diagnosis not present

## 2022-05-12 ENCOUNTER — Inpatient Hospital Stay: Payer: Medicare HMO

## 2022-05-12 ENCOUNTER — Ambulatory Visit: Payer: Self-pay | Admitting: Oncology

## 2022-05-12 NOTE — Progress Notes (Signed)
[facility]  [address] [place],  [place]  [address] [phone]  Clinic Day:  05/13/2022  Referring physician: Greig Right, MD  HISTORY OF PRESENT ILLNESS:  The patient is a 71 y.o. female with essential thrombocythemia.  She takes hydroxyurea 500 mg twice daily Monday through Friday to keep her platelets from rapidly rising.  She comes in today for routine follow-up.  Since her last visit, the patient's been doing well.  She is compliant with her hydroxyurea prescription as currently written.  She denies having any clotting complications from her underlying essential thrombocythemia.  PHYSICAL EXAM:  Blood pressure (!) 178/84, pulse 65, temperature (!) 97.4 F (36.3 C), resp. rate 16, height '5\' 6"'$  (1.676 m), weight 200 lb 4.8 oz (90.9 kg), SpO2 96 %. Wt Readings from Last 3 Encounters:  05/13/22 200 lb 4.8 oz (90.9 kg)  02/09/22 195 lb 12.8 oz (88.8 kg)  07/17/21 192 lb (87.1 kg)   Body mass index is 32.33 kg/m. Performance status (ECOG): 0 - Asymptomatic Physical Exam Constitutional:      Appearance: Normal appearance. She is not ill-appearing.  HENT:     Mouth/Throat:     Mouth: Mucous membranes are moist.     Pharynx: Oropharynx is clear. No oropharyngeal exudate or posterior oropharyngeal erythema.  Cardiovascular:     Rate and Rhythm: Normal rate and regular rhythm.     Heart sounds: No murmur heard.    No friction rub. No gallop.  Pulmonary:     Effort: Pulmonary effort is normal. No respiratory distress.     Breath sounds: Normal breath sounds. No wheezing, rhonchi or rales.  Abdominal:     General: Bowel sounds are normal. There is no distension.     Palpations: Abdomen is soft. There is no mass.     Tenderness: There is no abdominal tenderness.  Musculoskeletal:        General: No swelling.     Right lower leg: No edema.     Left lower leg: No edema.  Lymphadenopathy:     Cervical: No cervical adenopathy.     Upper  Body:     Right upper body: No supraclavicular or axillary adenopathy.     Left upper body: No supraclavicular or axillary adenopathy.     Lower Body: No right inguinal adenopathy. No left inguinal adenopathy.  Skin:    General: Skin is warm.     Coloration: Skin is not jaundiced.     Findings: No lesion or rash.  Neurological:     General: No focal deficit present.     Mental Status: She is alert and oriented to person, place, and time. Mental status is at baseline.  Psychiatric:        Mood and Affect: Mood normal.        Behavior: Behavior normal.        Thought Content: Thought content normal.    LABS:    Latest Reference Range & Units 05/13/22 00:00  WBC  7.9 (E)  RBC 3.87 - 5.11  4.07 (E)  Hemoglobin 12.0 - 16.0  13.1 (E)  HCT 36 - 46  39 (E)  Platelets 150 - 400 K/uL 533 ! (E)  !: Data is abnormal (E): External lab result  ASSESSMENT & PLAN:  Assessment/Plan:  A 71 y.o. female with essential thrombocythemia.  I am pleased as her platelets are below 600 today.  Based upon this, she will continue taking hydroxyurea 500 mg twice daily  Monday to Friday.  She knows to continue taking a baby aspirin on a daily basis to offset any potential clotting complications her essential thrombocythemia can cause.  I will see her back in 6 months for repeat clinical assessment.  The patient understands all the plans discussed today and is in agreement with them.    Quinnlan Abruzzo Macarthur Critchley, MD

## 2022-05-13 ENCOUNTER — Inpatient Hospital Stay: Payer: Medicare HMO

## 2022-05-13 ENCOUNTER — Inpatient Hospital Stay: Payer: Medicare HMO | Attending: Oncology | Admitting: Oncology

## 2022-05-13 ENCOUNTER — Other Ambulatory Visit: Payer: Self-pay | Admitting: Oncology

## 2022-05-13 VITALS — BP 178/84 | HR 65 | Temp 97.4°F | Resp 16 | Ht 66.0 in | Wt 200.3 lb

## 2022-05-13 DIAGNOSIS — D473 Essential (hemorrhagic) thrombocythemia: Secondary | ICD-10-CM

## 2022-05-13 DIAGNOSIS — D649 Anemia, unspecified: Secondary | ICD-10-CM | POA: Diagnosis not present

## 2022-05-13 LAB — CBC AND DIFFERENTIAL
HCT: 39 (ref 36–46)
Hemoglobin: 13.1 (ref 12.0–16.0)
Neutrophils Absolute: 5.21
Platelets: 533 10*3/uL — AB (ref 150–400)
WBC: 7.9

## 2022-05-13 LAB — CBC: RBC: 4.07 (ref 3.87–5.11)

## 2022-05-31 DIAGNOSIS — U071 COVID-19: Secondary | ICD-10-CM | POA: Diagnosis not present

## 2022-05-31 DIAGNOSIS — R509 Fever, unspecified: Secondary | ICD-10-CM | POA: Diagnosis not present

## 2022-05-31 DIAGNOSIS — R112 Nausea with vomiting, unspecified: Secondary | ICD-10-CM | POA: Diagnosis not present

## 2022-05-31 DIAGNOSIS — R0982 Postnasal drip: Secondary | ICD-10-CM | POA: Diagnosis not present

## 2022-08-22 DIAGNOSIS — J069 Acute upper respiratory infection, unspecified: Secondary | ICD-10-CM | POA: Diagnosis not present

## 2022-08-22 DIAGNOSIS — R051 Acute cough: Secondary | ICD-10-CM | POA: Diagnosis not present

## 2022-08-22 DIAGNOSIS — I10 Essential (primary) hypertension: Secondary | ICD-10-CM | POA: Diagnosis not present

## 2022-11-26 ENCOUNTER — Inpatient Hospital Stay: Payer: BC Managed Care – PPO | Attending: Oncology

## 2022-11-26 ENCOUNTER — Inpatient Hospital Stay: Payer: BC Managed Care – PPO | Admitting: Oncology

## 2022-11-26 DIAGNOSIS — D473 Essential (hemorrhagic) thrombocythemia: Secondary | ICD-10-CM | POA: Insufficient documentation

## 2022-11-26 LAB — CBC WITH DIFFERENTIAL (CANCER CENTER ONLY)
Abs Immature Granulocytes: 0.02 10*3/uL (ref 0.00–0.07)
Basophils Absolute: 0 10*3/uL (ref 0.0–0.1)
Basophils Relative: 0 %
Eosinophils Absolute: 0.2 10*3/uL (ref 0.0–0.5)
Eosinophils Relative: 3 %
HCT: 40.8 % (ref 36.0–46.0)
Hemoglobin: 13.1 g/dL (ref 12.0–15.0)
Immature Granulocytes: 0 %
Lymphocytes Relative: 30 %
Lymphs Abs: 2.1 10*3/uL (ref 0.7–4.0)
MCH: 33.1 pg (ref 26.0–34.0)
MCHC: 32.1 g/dL (ref 30.0–36.0)
MCV: 103 fL — ABNORMAL HIGH (ref 80.0–100.0)
Monocytes Absolute: 0.3 10*3/uL (ref 0.1–1.0)
Monocytes Relative: 5 %
Neutro Abs: 4.2 10*3/uL (ref 1.7–7.7)
Neutrophils Relative %: 62 %
Platelet Count: 491 10*3/uL — ABNORMAL HIGH (ref 150–400)
RBC: 3.96 MIL/uL (ref 3.87–5.11)
RDW: 15.5 % (ref 11.5–15.5)
WBC Count: 6.9 10*3/uL (ref 4.0–10.5)
nRBC: 0 % (ref 0.0–0.2)

## 2022-12-03 ENCOUNTER — Other Ambulatory Visit: Payer: Self-pay | Admitting: Hematology and Oncology

## 2022-12-03 ENCOUNTER — Inpatient Hospital Stay: Payer: BC Managed Care – PPO | Admitting: Oncology

## 2022-12-03 DIAGNOSIS — D473 Essential (hemorrhagic) thrombocythemia: Secondary | ICD-10-CM

## 2022-12-09 NOTE — Progress Notes (Unsigned)
[facility]  [address] [place],  [place]  [address] [phone]  Clinic Day:  12/10/2022  Referring physician: Alinda Deem, MD  HISTORY OF PRESENT ILLNESS:  The patient is a 73 y.o. female with essential thrombocythemia.  She takes hydroxyurea 500 mg twice daily Monday through Friday to keep her platelets from rapidly rising.  She comes in today for routine follow-up.  Since her last visit, the patient has been doing well.  She remains compliant with her hydroxyurea prescription.  She denies having any clotting complications from her underlying essential thrombocythemia.  PHYSICAL EXAM:  Blood pressure (!) 170/75, pulse 81, temperature 98.3 F (36.8 C), resp. rate 16, height 5\' 6"  (1.676 m), weight 188 lb 8 oz (85.5 kg), SpO2 97 %. Wt Readings from Last 3 Encounters:  12/10/22 188 lb 8 oz (85.5 kg)  05/13/22 200 lb 4.8 oz (90.9 kg)  02/09/22 195 lb 12.8 oz (88.8 kg)   Body mass index is 30.42 kg/m. Performance status (ECOG): 0 - Asymptomatic Physical Exam Constitutional:      Appearance: Normal appearance. She is not ill-appearing.  HENT:     Mouth/Throat:     Mouth: Mucous membranes are moist.     Pharynx: Oropharynx is clear. No oropharyngeal exudate or posterior oropharyngeal erythema.  Cardiovascular:     Rate and Rhythm: Normal rate and regular rhythm.     Heart sounds: No murmur heard.    No friction rub. No gallop.  Pulmonary:     Effort: Pulmonary effort is normal. No respiratory distress.     Breath sounds: Normal breath sounds. No wheezing, rhonchi or rales.  Abdominal:     General: Bowel sounds are normal. There is no distension.     Palpations: Abdomen is soft. There is no mass.     Tenderness: There is no abdominal tenderness.  Musculoskeletal:        General: No swelling.     Right lower leg: No edema.     Left lower leg: No edema.  Lymphadenopathy:     Cervical: No cervical adenopathy.     Upper Body:     Right  upper body: No supraclavicular or axillary adenopathy.     Left upper body: No supraclavicular or axillary adenopathy.     Lower Body: No right inguinal adenopathy. No left inguinal adenopathy.  Skin:    General: Skin is warm.     Coloration: Skin is not jaundiced.     Findings: No lesion or rash.  Neurological:     General: No focal deficit present.     Mental Status: She is alert and oriented to person, place, and time. Mental status is at baseline.  Psychiatric:        Mood and Affect: Mood normal.        Behavior: Behavior normal.        Thought Content: Thought content normal.    LABS:    Latest Reference Range & Units 11/26/22 09:26  WBC 4.0 - 10.5 K/uL 6.9  RBC 3.87 - 5.11 MIL/uL 3.96  Hemoglobin 12.0 - 15.0 g/dL 63.8  HCT 46.6 - 59.9 % 40.8  MCV 80.0 - 100.0 fL 103.0 (H)  MCH 26.0 - 34.0 pg 33.1  MCHC 30.0 - 36.0 g/dL 35.7  RDW 01.7 - 79.3 % 15.5  Platelets 150 - 400 K/uL 491 (H)  nRBC 0.0 - 0.2 % 0.0  Neutrophils % 62  Lymphocytes % 30  Monocytes Relative % 5  Eosinophil % 3  Basophil % 0  Immature Granulocytes % 0  (H): Data is abnormally high  ASSESSMENT & PLAN:  Assessment/Plan:  A 72 y.o. female with essential thrombocythemia.  I am pleased as her platelets remain below 600 today.  Based upon this, she will continue taking hydroxyurea 500 mg twice daily Monday to Friday.  She knows to continue taking a baby aspirin on a daily basis to offset any potential clotting complications her essential thrombocythemia can cause.  As she is doing well, I will see her back in 6 months for repeat clinical assessment.  The patient understands all the plans discussed today and is in agreement with them.    Robecca Fulgham Kirby Funk, MD

## 2022-12-10 ENCOUNTER — Inpatient Hospital Stay: Payer: BC Managed Care – PPO | Attending: Oncology | Admitting: Oncology

## 2022-12-10 ENCOUNTER — Other Ambulatory Visit: Payer: Self-pay | Admitting: Oncology

## 2022-12-10 VITALS — BP 170/75 | HR 81 | Temp 98.3°F | Resp 16 | Ht 66.0 in | Wt 188.5 lb

## 2022-12-10 DIAGNOSIS — D473 Essential (hemorrhagic) thrombocythemia: Secondary | ICD-10-CM

## 2022-12-10 DIAGNOSIS — H524 Presbyopia: Secondary | ICD-10-CM | POA: Diagnosis not present

## 2022-12-10 DIAGNOSIS — H5213 Myopia, bilateral: Secondary | ICD-10-CM | POA: Diagnosis not present

## 2022-12-10 DIAGNOSIS — H25819 Combined forms of age-related cataract, unspecified eye: Secondary | ICD-10-CM | POA: Diagnosis not present

## 2022-12-10 DIAGNOSIS — H52209 Unspecified astigmatism, unspecified eye: Secondary | ICD-10-CM | POA: Diagnosis not present

## 2022-12-18 NOTE — Telephone Encounter (Signed)
DOne

## 2023-05-22 DIAGNOSIS — R051 Acute cough: Secondary | ICD-10-CM | POA: Diagnosis not present

## 2023-05-22 DIAGNOSIS — J209 Acute bronchitis, unspecified: Secondary | ICD-10-CM | POA: Diagnosis not present

## 2023-05-22 DIAGNOSIS — J45909 Unspecified asthma, uncomplicated: Secondary | ICD-10-CM | POA: Diagnosis not present

## 2023-06-10 NOTE — Progress Notes (Signed)
[facility]  [address] [place],  [place]  [address] [phone]  Clinic Day:  06/11/2023  Referring physician: Alinda Deem, MD  HISTORY OF PRESENT ILLNESS:  The patient is a 72 y.o. female with essential thrombocythemia.  She takes hydroxyurea 500 mg twice daily Monday through Friday to keep her platelets from rapidly rising.  She comes in today for routine follow-up.  Since her last visit, the patient has been doing well.  She remains compliant with her hydroxyurea prescription.  She denies having any clotting complications from her underlying essential thrombocythemia.  PHYSICAL EXAM:  Blood pressure (!) 140/64, pulse 62, temperature 97.8 F (36.6 C), resp. rate 14, height 5\' 6"  (1.676 m), weight 192 lb 11.2 oz (87.4 kg), SpO2 98%. Wt Readings from Last 3 Encounters:  06/11/23 192 lb 11.2 oz (87.4 kg)  12/10/22 188 lb 8 oz (85.5 kg)  05/13/22 200 lb 4.8 oz (90.9 kg)   Body mass index is 31.1 kg/m. Performance status (ECOG): 0 - Asymptomatic Physical Exam Constitutional:      Appearance: Normal appearance. She is not ill-appearing.  HENT:     Mouth/Throat:     Mouth: Mucous membranes are moist.     Pharynx: Oropharynx is clear. No oropharyngeal exudate or posterior oropharyngeal erythema.  Cardiovascular:     Rate and Rhythm: Normal rate and regular rhythm.     Heart sounds: No murmur heard.    No friction rub. No gallop.  Pulmonary:     Effort: Pulmonary effort is normal. No respiratory distress.     Breath sounds: Normal breath sounds. No wheezing, rhonchi or rales.  Abdominal:     General: Bowel sounds are normal. There is no distension.     Palpations: Abdomen is soft. There is no mass.     Tenderness: There is no abdominal tenderness.  Musculoskeletal:        General: No swelling.     Right lower leg: No edema.     Left lower leg: No edema.  Lymphadenopathy:     Cervical: No cervical adenopathy.     Upper Body:      Right upper body: No supraclavicular or axillary adenopathy.     Left upper body: No supraclavicular or axillary adenopathy.     Lower Body: No right inguinal adenopathy. No left inguinal adenopathy.  Skin:    General: Skin is warm.     Coloration: Skin is not jaundiced.     Findings: No lesion or rash.  Neurological:     General: No focal deficit present.     Mental Status: She is alert and oriented to person, place, and time. Mental status is at baseline.  Psychiatric:        Mood and Affect: Mood normal.        Behavior: Behavior normal.        Thought Content: Thought content normal.    LABS:     ASSESSMENT & PLAN:  Assessment/Plan:  A 72 y.o. female with essential thrombocythemia.  I am pleased as her platelets remain well below 600 today.  Based upon this, she will continue taking hydroxyurea 500 mg twice daily Monday to Friday.  She knows to continue taking a baby aspirin on a daily basis to offset any potential clotting complications her essential thrombocythemia can cause.  As she is doing well, I will see her back in another 6 months for repeat clinical assessment.  The patient understands all the plans discussed today  and is in agreement with them.    Mechele Kittleson Kirby Funk, MD

## 2023-06-11 ENCOUNTER — Other Ambulatory Visit: Payer: Self-pay | Admitting: Oncology

## 2023-06-11 ENCOUNTER — Inpatient Hospital Stay: Payer: BC Managed Care – PPO

## 2023-06-11 ENCOUNTER — Inpatient Hospital Stay: Payer: BC Managed Care – PPO | Attending: Oncology | Admitting: Oncology

## 2023-06-11 VITALS — BP 140/64 | HR 62 | Temp 97.8°F | Resp 14 | Ht 66.0 in | Wt 192.7 lb

## 2023-06-11 DIAGNOSIS — D473 Essential (hemorrhagic) thrombocythemia: Secondary | ICD-10-CM | POA: Diagnosis not present

## 2023-06-11 LAB — CBC AND DIFFERENTIAL
HCT: 37 (ref 36–46)
Hemoglobin: 12.6 (ref 12.0–16.0)
Neutrophils Absolute: 3.72
Platelets: 481 10*3/uL — AB (ref 150–400)
WBC: 6.2

## 2023-06-11 LAB — CBC: RBC: 3.76 — AB (ref 3.87–5.11)

## 2023-06-13 ENCOUNTER — Other Ambulatory Visit: Payer: Self-pay | Admitting: Oncology

## 2023-06-13 DIAGNOSIS — D473 Essential (hemorrhagic) thrombocythemia: Secondary | ICD-10-CM

## 2023-10-11 ENCOUNTER — Other Ambulatory Visit: Payer: Self-pay | Admitting: Oncology

## 2023-10-11 DIAGNOSIS — D473 Essential (hemorrhagic) thrombocythemia: Secondary | ICD-10-CM

## 2023-10-11 MED ORDER — HYDROXYUREA 500 MG PO CAPS: 500.0000 mg | ORAL_CAPSULE | Freq: Two times a day (BID) | ORAL | 2 refills | Status: AC

## 2023-12-14 ENCOUNTER — Inpatient Hospital Stay: Payer: BC Managed Care – PPO

## 2023-12-14 ENCOUNTER — Inpatient Hospital Stay: Payer: BC Managed Care – PPO | Admitting: Oncology

## 2023-12-21 DIAGNOSIS — H5213 Myopia, bilateral: Secondary | ICD-10-CM | POA: Diagnosis not present

## 2023-12-21 DIAGNOSIS — H524 Presbyopia: Secondary | ICD-10-CM | POA: Diagnosis not present

## 2023-12-29 ENCOUNTER — Other Ambulatory Visit: Payer: Self-pay

## 2023-12-29 DIAGNOSIS — D473 Essential (hemorrhagic) thrombocythemia: Secondary | ICD-10-CM

## 2023-12-29 NOTE — Progress Notes (Signed)
 [facility]  [address] [place],  [place]  [address] [phone]  Clinic Day:  12/30/2023  Referring physician: Orin Birk, MD  HISTORY OF PRESENT ILLNESS:  The patient is a 73 y.o. female with essential thrombocythemia.  She takes hydroxyurea  500 mg twice daily Monday through Friday to keep her platelets from rapidly rising.  She comes in today for routine follow-up.  Since her last visit, the patient has been doing well.  She remains compliant with her hydroxyurea  prescription.  She denies having any clotting complications from her underlying essential thrombocythemia.  PHYSICAL EXAM:  Blood pressure 137/74, pulse 72, temperature 98.1 F (36.7 C), temperature source Oral, resp. rate 16, height 5\' 6"  (1.676 m), weight 186 lb 11.2 oz (84.7 kg), SpO2 100%. Wt Readings from Last 3 Encounters:  12/30/23 186 lb 11.2 oz (84.7 kg)  06/11/23 192 lb 11.2 oz (87.4 kg)  12/10/22 188 lb 8 oz (85.5 kg)   Body mass index is 30.13 kg/m. Performance status (ECOG): 0 - Asymptomatic Physical Exam Constitutional:      Appearance: Normal appearance. She is not ill-appearing.  HENT:     Mouth/Throat:     Mouth: Mucous membranes are moist.     Pharynx: Oropharynx is clear. No oropharyngeal exudate or posterior oropharyngeal erythema.  Cardiovascular:     Rate and Rhythm: Normal rate and regular rhythm.     Heart sounds: No murmur heard.    No friction rub. No gallop.  Pulmonary:     Effort: Pulmonary effort is normal. No respiratory distress.     Breath sounds: Normal breath sounds. No wheezing, rhonchi or rales.  Abdominal:     General: Bowel sounds are normal. There is no distension.     Palpations: Abdomen is soft. There is no mass.     Tenderness: There is no abdominal tenderness.  Musculoskeletal:        General: No swelling.     Right lower leg: No edema.     Left lower leg: No edema.  Lymphadenopathy:     Cervical: No cervical adenopathy.      Upper Body:     Right upper body: No supraclavicular or axillary adenopathy.     Left upper body: No supraclavicular or axillary adenopathy.     Lower Body: No right inguinal adenopathy. No left inguinal adenopathy.  Skin:    General: Skin is warm.     Coloration: Skin is not jaundiced.     Findings: No lesion or rash.  Neurological:     General: No focal deficit present.     Mental Status: She is alert and oriented to person, place, and time. Mental status is at baseline.  Psychiatric:        Mood and Affect: Mood normal.        Behavior: Behavior normal.        Thought Content: Thought content normal.    LABS:    Latest Reference Range & Units 12/30/23 09:27  WBC 4.0 - 10.5 K/uL 6.9  RBC 3.87 - 5.11 MIL/uL 3.78 (L)  Hemoglobin 12.0 - 15.0 g/dL 36.6  HCT 44.0 - 34.7 % 37.6  MCV 80.0 - 100.0 fL 99.5  MCH 26.0 - 34.0 pg 33.6  MCHC 30.0 - 36.0 g/dL 42.5  RDW 95.6 - 38.7 % 15.0  Platelets 150 - 400 K/uL 467 (H)  nRBC 0.0 - 0.2 % 0 /100 WBC 0.00 0  Neutrophils % 67  Lymphocytes % 25  Monocytes Relative % 5  Eosinophil % 3  Basophil % 0  Immature Granulocytes % 0  (L): Data is abnormally low (H): Data is abnormally high   ASSESSMENT & PLAN:  Assessment/Plan:  A 73 y.o. female with essential thrombocythemia.  I am pleased as her platelets remain well below 600 today.  Based upon this, she will continue taking hydroxyurea  500 mg twice daily Monday to Friday.  She knows to continue taking a baby aspirin on a daily basis to offset any potential clotting complications her essential thrombocythemia can cause.  As she is doing well, I will see her back in another 6 months for repeat clinical assessment.  The patient understands all the plans discussed today and is in agreement with them.    Kenyia Wambolt Felicia Horde, MD

## 2023-12-30 ENCOUNTER — Inpatient Hospital Stay: Attending: Oncology

## 2023-12-30 ENCOUNTER — Other Ambulatory Visit: Payer: Self-pay | Admitting: Oncology

## 2023-12-30 ENCOUNTER — Inpatient Hospital Stay (HOSPITAL_BASED_OUTPATIENT_CLINIC_OR_DEPARTMENT_OTHER): Admitting: Oncology

## 2023-12-30 VITALS — BP 137/74 | HR 72 | Temp 98.1°F | Resp 16 | Ht 66.0 in | Wt 186.7 lb

## 2023-12-30 DIAGNOSIS — D473 Essential (hemorrhagic) thrombocythemia: Secondary | ICD-10-CM

## 2023-12-30 DIAGNOSIS — D75839 Thrombocytosis, unspecified: Secondary | ICD-10-CM | POA: Insufficient documentation

## 2023-12-30 DIAGNOSIS — Z7964 Long term (current) use of myelosuppressive agent: Secondary | ICD-10-CM | POA: Insufficient documentation

## 2023-12-30 LAB — CBC WITH DIFFERENTIAL (CANCER CENTER ONLY)
Abs Immature Granulocytes: 0.03 10*3/uL (ref 0.00–0.07)
Basophils Absolute: 0 10*3/uL (ref 0.0–0.1)
Basophils Relative: 0 %
Eosinophils Absolute: 0.2 10*3/uL (ref 0.0–0.5)
Eosinophils Relative: 3 %
HCT: 37.6 % (ref 36.0–46.0)
Hemoglobin: 12.7 g/dL (ref 12.0–15.0)
Immature Granulocytes: 0 %
Lymphocytes Relative: 25 %
Lymphs Abs: 1.7 10*3/uL (ref 0.7–4.0)
MCH: 33.6 pg (ref 26.0–34.0)
MCHC: 33.8 g/dL (ref 30.0–36.0)
MCV: 99.5 fL (ref 80.0–100.0)
Monocytes Absolute: 0.4 10*3/uL (ref 0.1–1.0)
Monocytes Relative: 5 %
Neutro Abs: 4.6 10*3/uL (ref 1.7–7.7)
Neutrophils Relative %: 67 %
Platelet Count: 467 10*3/uL — ABNORMAL HIGH (ref 150–400)
RBC: 3.78 MIL/uL — ABNORMAL LOW (ref 3.87–5.11)
RDW: 15 % (ref 11.5–15.5)
WBC Count: 6.9 10*3/uL (ref 4.0–10.5)
nRBC: 0 % (ref 0.0–0.2)
nRBC: 0 /100{WBCs}

## 2024-01-31 ENCOUNTER — Telehealth: Payer: Self-pay | Admitting: Oncology

## 2024-01-31 NOTE — Telephone Encounter (Signed)
 01/31/24 LVM to next appt on 07/03/24 arrive at 915am.

## 2024-07-02 NOTE — Progress Notes (Deleted)
 [facility]  [address] [place],  [place]  [address] [phone]  Clinic Day:  07/02/2024  Referring physician: Gayl Males, MD  HISTORY OF PRESENT ILLNESS:  The patient is a 73 y.o. female with essential thrombocythemia.  She takes hydroxyurea  500 mg twice daily Monday through Friday to keep her platelets from rapidly rising.  She comes in today for routine follow-up.  Since her last visit, the patient has been doing well.  She remains compliant with her hydroxyurea  prescription.  She denies having any clotting complications from her underlying essential thrombocythemia.  PHYSICAL EXAM:  There were no vitals taken for this visit. Wt Readings from Last 3 Encounters:  12/30/23 186 lb 11.2 oz (84.7 kg)  06/11/23 192 lb 11.2 oz (87.4 kg)  12/10/22 188 lb 8 oz (85.5 kg)   There is no height or weight on file to calculate BMI. Performance status (ECOG): 0 - Asymptomatic Physical Exam Constitutional:      Appearance: Normal appearance. She is not ill-appearing.  HENT:     Mouth/Throat:     Mouth: Mucous membranes are moist.     Pharynx: Oropharynx is clear. No oropharyngeal exudate or posterior oropharyngeal erythema.  Cardiovascular:     Rate and Rhythm: Normal rate and regular rhythm.     Heart sounds: No murmur heard.    No friction rub. No gallop.  Pulmonary:     Effort: Pulmonary effort is normal. No respiratory distress.     Breath sounds: Normal breath sounds. No wheezing, rhonchi or rales.  Abdominal:     General: Bowel sounds are normal. There is no distension.     Palpations: Abdomen is soft. There is no mass.     Tenderness: There is no abdominal tenderness.  Musculoskeletal:        General: No swelling.     Right lower leg: No edema.     Left lower leg: No edema.  Lymphadenopathy:     Cervical: No cervical adenopathy.     Upper Body:     Right upper body: No supraclavicular or axillary adenopathy.     Left upper body: No  supraclavicular or axillary adenopathy.     Lower Body: No right inguinal adenopathy. No left inguinal adenopathy.  Skin:    General: Skin is warm.     Coloration: Skin is not jaundiced.     Findings: No lesion or rash.  Neurological:     General: No focal deficit present.     Mental Status: She is alert and oriented to person, place, and time. Mental status is at baseline.  Psychiatric:        Mood and Affect: Mood normal.        Behavior: Behavior normal.        Thought Content: Thought content normal.    LABS:    Latest Reference Range & Units 12/30/23 09:27  WBC 4.0 - 10.5 K/uL 6.9  RBC 3.87 - 5.11 MIL/uL 3.78 (L)  Hemoglobin 12.0 - 15.0 g/dL 87.2  HCT 63.9 - 53.9 % 37.6  MCV 80.0 - 100.0 fL 99.5  MCH 26.0 - 34.0 pg 33.6  MCHC 30.0 - 36.0 g/dL 66.1  RDW 88.4 - 84.4 % 15.0  Platelets 150 - 400 K/uL 467 (H)  nRBC 0.0 - 0.2 % 0 /100 WBC 0.00 0  Neutrophils % 67  Lymphocytes % 25  Monocytes Relative % 5  Eosinophil % 3  Basophil % 0  Immature Granulocytes % 0  (  L): Data is abnormally low (H): Data is abnormally high   ASSESSMENT & PLAN:  Assessment/Plan:  A 73 y.o. female with essential thrombocythemia.  I am pleased as her platelets remain well below 600 today.  Based upon this, she will continue taking hydroxyurea  500 mg twice daily Monday to Friday.  She knows to continue taking a baby aspirin on a daily basis to offset any potential clotting complications her essential thrombocythemia can cause.  As she is doing well, I will see her back in another 6 months for repeat clinical assessment.  The patient understands all the plans discussed today and is in agreement with them.    Eliot Bencivenga DELENA Kerns, MD

## 2024-07-03 ENCOUNTER — Inpatient Hospital Stay: Admitting: Oncology

## 2024-07-03 ENCOUNTER — Inpatient Hospital Stay

## 2024-07-03 NOTE — Progress Notes (Deleted)
 [facility]  [address] [place],  [place]  [address] [phone]  Clinic Day:  07/03/2024  Referring physician: Gayl Males, MD  HISTORY OF PRESENT ILLNESS:  The patient is a 73 y.o. female with essential thrombocythemia.  She takes hydroxyurea  500 mg twice daily Monday through Friday to keep her platelets from rapidly rising.  She comes in today for routine follow-up.  Since her last visit, the patient has been doing well.  She remains compliant with her hydroxyurea  prescription.  She denies having any clotting complications from her underlying essential thrombocythemia.  PHYSICAL EXAM:  There were no vitals taken for this visit. Wt Readings from Last 3 Encounters:  12/30/23 186 lb 11.2 oz (84.7 kg)  06/11/23 192 lb 11.2 oz (87.4 kg)  12/10/22 188 lb 8 oz (85.5 kg)   There is no height or weight on file to calculate BMI. Performance status (ECOG): 0 - Asymptomatic Physical Exam Constitutional:      Appearance: Normal appearance. She is not ill-appearing.  HENT:     Mouth/Throat:     Mouth: Mucous membranes are moist.     Pharynx: Oropharynx is clear. No oropharyngeal exudate or posterior oropharyngeal erythema.  Cardiovascular:     Rate and Rhythm: Normal rate and regular rhythm.     Heart sounds: No murmur heard.    No friction rub. No gallop.  Pulmonary:     Effort: Pulmonary effort is normal. No respiratory distress.     Breath sounds: Normal breath sounds. No wheezing, rhonchi or rales.  Abdominal:     General: Bowel sounds are normal. There is no distension.     Palpations: Abdomen is soft. There is no mass.     Tenderness: There is no abdominal tenderness.  Musculoskeletal:        General: No swelling.     Right lower leg: No edema.     Left lower leg: No edema.  Lymphadenopathy:     Cervical: No cervical adenopathy.     Upper Body:     Right upper body: No supraclavicular or axillary adenopathy.     Left upper body: No  supraclavicular or axillary adenopathy.     Lower Body: No right inguinal adenopathy. No left inguinal adenopathy.  Skin:    General: Skin is warm.     Coloration: Skin is not jaundiced.     Findings: No lesion or rash.  Neurological:     General: No focal deficit present.     Mental Status: She is alert and oriented to person, place, and time. Mental status is at baseline.  Psychiatric:        Mood and Affect: Mood normal.        Behavior: Behavior normal.        Thought Content: Thought content normal.    LABS:    Latest Reference Range & Units 12/30/23 09:27  WBC 4.0 - 10.5 K/uL 6.9  RBC 3.87 - 5.11 MIL/uL 3.78 (L)  Hemoglobin 12.0 - 15.0 g/dL 87.2  HCT 63.9 - 53.9 % 37.6  MCV 80.0 - 100.0 fL 99.5  MCH 26.0 - 34.0 pg 33.6  MCHC 30.0 - 36.0 g/dL 66.1  RDW 88.4 - 84.4 % 15.0  Platelets 150 - 400 K/uL 467 (H)  nRBC 0.0 - 0.2 % 0 /100 WBC 0.00 0  Neutrophils % 67  Lymphocytes % 25  Monocytes Relative % 5  Eosinophil % 3  Basophil % 0  Immature Granulocytes % 0  (  L): Data is abnormally low (H): Data is abnormally high   ASSESSMENT & PLAN:  Assessment/Plan:  A 73 y.o. female with essential thrombocythemia.  I am pleased as her platelets remain well below 600 today.  Based upon this, she will continue taking hydroxyurea  500 mg twice daily Monday to Friday.  She knows to continue taking a baby aspirin on a daily basis to offset any potential clotting complications her essential thrombocythemia can cause.  As she is doing well, I will see her back in another 6 months for repeat clinical assessment.  The patient understands all the plans discussed today and is in agreement with them.    Ponce Skillman DELENA Kerns, MD

## 2024-07-04 ENCOUNTER — Inpatient Hospital Stay: Admitting: Oncology

## 2024-07-04 ENCOUNTER — Inpatient Hospital Stay

## 2024-07-10 NOTE — Progress Notes (Unsigned)
 [facility]  [address] [place],  [place]  [address] [phone]  Clinic Day:  07/10/2024  Referring physician: Gayl Males, MD  HISTORY OF PRESENT ILLNESS:  The patient is a 73 y.o. female with essential thrombocythemia.  She takes hydroxyurea  500 mg twice daily Monday through Friday to keep her platelets from rapidly rising.  She comes in today for routine follow-up.  Since her last visit, the patient has been doing well.  She remains compliant with her hydroxyurea  prescription.  She denies having any clotting complications from her underlying essential thrombocythemia.  PHYSICAL EXAM:  There were no vitals taken for this visit. Wt Readings from Last 3 Encounters:  12/30/23 186 lb 11.2 oz (84.7 kg)  06/11/23 192 lb 11.2 oz (87.4 kg)  12/10/22 188 lb 8 oz (85.5 kg)   There is no height or weight on file to calculate BMI. Performance status (ECOG): 0 - Asymptomatic Physical Exam Constitutional:      Appearance: Normal appearance. She is not ill-appearing.  HENT:     Mouth/Throat:     Mouth: Mucous membranes are moist.     Pharynx: Oropharynx is clear. No oropharyngeal exudate or posterior oropharyngeal erythema.  Cardiovascular:     Rate and Rhythm: Normal rate and regular rhythm.     Heart sounds: No murmur heard.    No friction rub. No gallop.  Pulmonary:     Effort: Pulmonary effort is normal. No respiratory distress.     Breath sounds: Normal breath sounds. No wheezing, rhonchi or rales.  Abdominal:     General: Bowel sounds are normal. There is no distension.     Palpations: Abdomen is soft. There is no mass.     Tenderness: There is no abdominal tenderness.  Musculoskeletal:        General: No swelling.     Right lower leg: No edema.     Left lower leg: No edema.  Lymphadenopathy:     Cervical: No cervical adenopathy.     Upper Body:     Right upper body: No supraclavicular or axillary adenopathy.     Left upper body: No  supraclavicular or axillary adenopathy.     Lower Body: No right inguinal adenopathy. No left inguinal adenopathy.  Skin:    General: Skin is warm.     Coloration: Skin is not jaundiced.     Findings: No lesion or rash.  Neurological:     General: No focal deficit present.     Mental Status: She is alert and oriented to person, place, and time. Mental status is at baseline.  Psychiatric:        Mood and Affect: Mood normal.        Behavior: Behavior normal.        Thought Content: Thought content normal.    LABS:    Latest Reference Range & Units 12/30/23 09:27  WBC 4.0 - 10.5 K/uL 6.9  RBC 3.87 - 5.11 MIL/uL 3.78 (L)  Hemoglobin 12.0 - 15.0 g/dL 87.2  HCT 63.9 - 53.9 % 37.6  MCV 80.0 - 100.0 fL 99.5  MCH 26.0 - 34.0 pg 33.6  MCHC 30.0 - 36.0 g/dL 66.1  RDW 88.4 - 84.4 % 15.0  Platelets 150 - 400 K/uL 467 (H)  nRBC 0.0 - 0.2 % 0 /100 WBC 0.00 0  Neutrophils % 67  Lymphocytes % 25  Monocytes Relative % 5  Eosinophil % 3  Basophil % 0  Immature Granulocytes % 0  (  L): Data is abnormally low (H): Data is abnormally high   ASSESSMENT & PLAN:  Assessment/Plan:  A 73 y.o. female with essential thrombocythemia.  I am pleased as her platelets remain well below 600 today.  Based upon this, she will continue taking hydroxyurea  500 mg twice daily Monday to Friday.  She knows to continue taking a baby aspirin on a daily basis to offset any potential clotting complications her essential thrombocythemia can cause.  As she is doing well, I will see her back in another 6 months for repeat clinical assessment.  The patient understands all the plans discussed today and is in agreement with them.    Arpan Eskelson DELENA Kerns, MD

## 2024-07-11 ENCOUNTER — Inpatient Hospital Stay: Attending: Oncology

## 2024-07-11 ENCOUNTER — Inpatient Hospital Stay (HOSPITAL_BASED_OUTPATIENT_CLINIC_OR_DEPARTMENT_OTHER): Admitting: Oncology

## 2024-07-11 ENCOUNTER — Other Ambulatory Visit: Payer: Self-pay | Admitting: Oncology

## 2024-07-11 ENCOUNTER — Telehealth: Payer: Self-pay | Admitting: Oncology

## 2024-07-11 VITALS — BP 140/64 | HR 61 | Temp 97.7°F | Resp 14 | Ht 66.0 in | Wt 193.0 lb

## 2024-07-11 DIAGNOSIS — D473 Essential (hemorrhagic) thrombocythemia: Secondary | ICD-10-CM | POA: Insufficient documentation

## 2024-07-11 DIAGNOSIS — Z7964 Long term (current) use of myelosuppressive agent: Secondary | ICD-10-CM | POA: Diagnosis not present

## 2024-07-11 DIAGNOSIS — D75839 Thrombocytosis, unspecified: Secondary | ICD-10-CM | POA: Insufficient documentation

## 2024-07-11 LAB — CBC WITH DIFFERENTIAL (CANCER CENTER ONLY)
Abs Immature Granulocytes: 0.03 K/uL (ref 0.00–0.07)
Basophils Absolute: 0 K/uL (ref 0.0–0.1)
Basophils Relative: 1 %
Eosinophils Absolute: 0.4 K/uL (ref 0.0–0.5)
Eosinophils Relative: 5 %
HCT: 39.7 % (ref 36.0–46.0)
Hemoglobin: 12.9 g/dL (ref 12.0–15.0)
Immature Granulocytes: 0 %
Lymphocytes Relative: 29 %
Lymphs Abs: 2.2 K/uL (ref 0.7–4.0)
MCH: 32.2 pg (ref 26.0–34.0)
MCHC: 32.5 g/dL (ref 30.0–36.0)
MCV: 99 fL (ref 80.0–100.0)
Monocytes Absolute: 0.3 K/uL (ref 0.1–1.0)
Monocytes Relative: 4 %
Neutro Abs: 4.6 K/uL (ref 1.7–7.7)
Neutrophils Relative %: 61 %
Platelet Count: 517 K/uL — ABNORMAL HIGH (ref 150–400)
RBC: 4.01 MIL/uL (ref 3.87–5.11)
RDW: 16.4 % — ABNORMAL HIGH (ref 11.5–15.5)
WBC Count: 7.5 K/uL (ref 4.0–10.5)
nRBC: 0 % (ref 0.0–0.2)

## 2024-07-11 NOTE — Telephone Encounter (Signed)
 Patient has been scheduled for follow-up visit per 07/10/24 LOS.  LVM notifying pt of appt details, provided my direct number to pt if appt changes need to be made.

## 2025-01-08 ENCOUNTER — Inpatient Hospital Stay: Admitting: Oncology

## 2025-01-08 ENCOUNTER — Inpatient Hospital Stay
